# Patient Record
Sex: Female | Born: 1990 | Hispanic: Yes | Marital: Married | Smoking: Never smoker
Health system: Southern US, Community
[De-identification: ages and names within clinical notes are randomized; demographics above are authoritative.]

## PROBLEM LIST (undated history)

## (undated) DIAGNOSIS — R87629 Unspecified abnormal cytological findings in specimens from vagina: Secondary | ICD-10-CM

## (undated) HISTORY — PX: COLPOSCOPY: SHX161

## (undated) HISTORY — DX: Unspecified abnormal cytological findings in specimens from vagina: R87.629

## (undated) HISTORY — PX: LEEP: SHX91

---

## 2016-03-29 HISTORY — PX: DILATION AND CURETTAGE OF UTERUS: SHX78

## 2019-08-10 ENCOUNTER — Other Ambulatory Visit: Payer: Self-pay | Admitting: Family Medicine

## 2019-08-10 ENCOUNTER — Encounter: Payer: Self-pay | Admitting: Family Medicine

## 2019-08-10 ENCOUNTER — Ambulatory Visit (INDEPENDENT_AMBULATORY_CARE_PROVIDER_SITE_OTHER): Payer: 59 | Admitting: Family Medicine

## 2019-08-10 ENCOUNTER — Other Ambulatory Visit: Payer: Self-pay

## 2019-08-10 VITALS — BP 101/69 | HR 77 | Temp 98.4°F | Ht 68.0 in | Wt 168.0 lb

## 2019-08-10 DIAGNOSIS — R102 Pelvic and perineal pain: Secondary | ICD-10-CM | POA: Diagnosis not present

## 2019-08-10 DIAGNOSIS — Z8742 Personal history of other diseases of the female genital tract: Secondary | ICD-10-CM

## 2019-08-10 DIAGNOSIS — E7800 Pure hypercholesterolemia, unspecified: Secondary | ICD-10-CM | POA: Diagnosis not present

## 2019-08-10 MED ORDER — NAPROXEN SODIUM 550 MG PO TABS: 550.0000 mg | ORAL_TABLET | Freq: Two times a day (BID) | ORAL | 3 refills | Status: DC

## 2019-08-10 NOTE — Patient Instructions (Addendum)
If you have lab work done today you will be contacted with your lab results within the next 2 weeks.  If you have not heard from [place] then please contact us. The fastest way to get your results is to register for My Chart.   IF you received an x-ray today, you will receive an invoice from [facility]. Please contact [facility] at [phone] with questions or concerns regarding your invoice.   IF you received labwork today, you will receive an invoice from [place]. Please contact [facility] at [phone] with questions or concerns regarding your invoice.   Our billing staff will not be able to assist you with questions regarding bills from these companies.  You will be contacted with the lab results as soon as they are available. The fastest way to get your results is to activate your My Chart account. Instructions are located on the last page of this paperwork. If you have not heard from [place] regarding the results in 2 weeks, please contact this office.        Mittelschmerz Mittelschmerz Mittelschmerz es Human resources officer vientre (abdomen) que afecta a las mujeres. El dolor se produce entre perodos [place]. El dolor:  Afecta uno de los lados del vientre. El lado puede variar de un mes a otro.  Puede ser leve o muy intenso.  Puede durar minutos u horas. No dura ms de 1 o 2 das.  Puede ocurrir con una sensacin de Journalist, newspaper (nuseas).  Puede ocurrir con una pequea cantidad de sangrado de la vagina. Mittelschmerz es causada por el crecimiento y la liberacin de un vulo de un ovario. Es Neomia Dear parte natural del ciclo de ovulacin. A menudo ocurre unas 2 semanas despus de la finalizacin del perodo menstrual. Siga estas indicaciones en su casa: Controle la afeccin para Insurance risk surveyor cambio. Informe a su mdico acerca de los cambios. Tome estas medidas para aliviar el dolor:  Intente tomar un bao caliente.  Intente colocarse una  almohadilla trmica para relajar los msculos del vientre.  Tome los medicamentos de venta libre y los recetados solamente como se lo haya indicado el mdico.  Oceanographer a todas las visitas de seguimiento como se lo haya indicado el mdico. Esto es importante. Comunquese con un mdico si:  El dolor es muy intenso la mayora de [place].  El dolor dura ms de 24 horas.  El medicamento no IT trainer.  Tiene fiebre.  Siente Journalist, newspaper, y la sensacin no se va.  No deja de vomitar.  No le llega el perodo menstrual.  Tiene ms que una pequea cantidad de sangre que proviene de la vagina entre perodos [place]. Resumen  Mittelschmerz es una afeccin que afecta a las mujeres. Es Chief Technology Officer en el vientre que aparece [employer] perodos.  Esta afeccin es causada por el crecimiento y la liberacin de un vulo de un ovario.  El dolor puede afectar uno de los lados del vientre, puede ser leve o muy intenso, o puede hacer que sienta Journalist, newspaper.  Para ayudar con el dolor, tome un bao de inmersin caliente, utilice una almohadilla trmica o tome medicamentos.  Controle la afeccin para Insurance risk surveyor cambio. Sepa cundo debe ponerse en contacto con un mdico para pedir ayuda. Esta informacin no tiene Theme park manager el consejo del mdico. Asegrese de hacerle al mdico cualquier pregunta que tenga. Document Revised: 11/16/2017 Document Reviewed: 11/16/2017 Elsevier Patient Education  2020 Elsevier Inc.  

## 2019-08-10 NOTE — Progress Notes (Signed)
5/14/202110:30 AM  Dorothy Wells [birth year], 29 y.o., female [id number]  Chief Complaint  Patient presents with  . pelvic pain 2 weeks prior to menstrual period  . New Patient (Initial Visit)    HPI:   Patient is a 29 y.o. female who presents today for pelvic pain  About a month ago started having pelvic pain, right side, stabbing for about 3 days and then milder for about a week, about 1-2 weeks prior to menses 07/10/2019 - normal menses LMP Aug 08 2019 Denies any vaginal discharge, dysuria, nausea, vomiting, fever, chills, changes in BM She has never had similar pain Has h/o left ovarian cyst Sexually active, uses condoms G2P1011 - no GMD, mild BP elevation PP, no dx preclampsia Last pap 2019 in [place], per patient normal  She reports h/o elevated cholesterol She is fasting today and would like to have it rechecked     Depression screen PHQ 2/9 08/10/2019  Decreased Interest 0  Down, Depressed, Hopeless 0  PHQ - 2 Score 0    Fall Risk  08/10/2019  Falls in the past year? 0  Number falls in past yr: 0  Injury with Fall? 0  Follow up Falls evaluation completed     No Known Allergies  Prior to Admission medications   Not on File    History reviewed. No pertinent past medical history.  Past Surgical History:  Procedure Laterality Date  . DILATION AND CURETTAGE OF UTERUS  2018   miscarriage    Social History   Tobacco Use  . Smoking status: Never Smoker  . Smokeless tobacco: Never Used  Substance Use Topics  . Alcohol use: Never    Family History  Problem Relation Age of Onset  . Arthritis Mother   . Hypertension Maternal Grandfather   . Hypertension Paternal Grandfather     ROS Per hpi  OBJECTIVE:  Today's Vitals   08/10/19 1010  BP: 101/69  Pulse: 77  Temp: 98.4 F (36.9 C)  SpO2: 99%  Weight: 168 lb (76.2 kg)  Height: 5\' 8"  (1.727 m)   Body mass index is 25.54 kg/m.   Physical Exam Vitals and nursing note reviewed.   Constitutional:      Appearance: She is well-developed.  HENT:     Head: Normocephalic and atraumatic.     Mouth/Throat:     Pharynx: No oropharyngeal exudate.  Eyes:     General: No scleral icterus.    Conjunctiva/sclera: Conjunctivae normal.     Pupils: Pupils are equal, round, and reactive to light.  Cardiovascular:     Rate and Rhythm: Normal rate and regular rhythm.     Heart sounds: Normal heart sounds. No murmur. No friction rub. No gallop.   Pulmonary:     Effort: Pulmonary effort is normal.     Breath sounds: Normal breath sounds. No wheezing, rhonchi or rales.  Abdominal:     General: Bowel sounds are normal. There is no distension.     Palpations: Abdomen is soft.     Tenderness: There is no abdominal tenderness. There is no right CVA tenderness or left CVA tenderness.  Musculoskeletal:     Cervical back: Neck supple.  Skin:    General: Skin is warm and dry.  Neurological:     Mental Status: She is alert and oriented to person, place, and time.     No results found for this or any previous visit (from the past 24 hour(s)).  No results found.   ASSESSMENT  and PLAN  1. Pelvic pain History suggestive of mittelschmerz. Discussed supportive measures, new meds r/se/b and RTC precautions. Patient educational handout given.  2. History of ovarian cyst - US Pelvic Complete With Transvaginal; Future  3. Elevated cholesterol - Comprehensive metabolic panel - Lipid panel  Other orders - naproxen sodium (ANAPROX) 550 MG tablet; Take 1 tablet (550 mg total) by mouth 2 (two) times daily with a meal. Start taking 2 days prior to ovulation  Return if symptoms worsen or fail to improve.    Myles Lipps, MD Primary Care at [facility] [address] [place], [place] [address] Ph.  [phone] Fax [phone]

## 2019-08-11 LAB — COMPREHENSIVE METABOLIC PANEL
ALT: 8 IU/L (ref 0–32)
AST: 12 IU/L (ref 0–40)
Albumin/Globulin Ratio: 1.6 (ref 1.2–2.2)
Albumin: 4.3 g/dL (ref 3.9–5.0)
Alkaline Phosphatase: 129 IU/L — ABNORMAL HIGH (ref 39–117)
BUN/Creatinine Ratio: 16 (ref 9–23)
BUN: 12 mg/dL (ref 6–20)
Bilirubin Total: <0.2 mg/dL (ref 0.0–1.2)
CO2: 22 mmol/L (ref 20–29)
Calcium: 9.4 mg/dL (ref 8.7–10.2)
Chloride: 105 mmol/L (ref 96–106)
Creatinine, Ser: 0.73 mg/dL (ref 0.57–1.00)
GFR calc Af Amer: 131 mL/min/{1.73_m2} (ref >59)
GFR calc non Af Amer: 113 mL/min/{1.73_m2} (ref >59)
Globulin, Total: 2.7 g/dL (ref 1.5–4.5)
Glucose: 94 mg/dL (ref 65–99)
Potassium: 4.7 mmol/L (ref 3.5–5.2)
Sodium: 140 mmol/L (ref 134–144)
Total Protein: 7 g/dL (ref 6.0–8.5)

## 2019-08-11 LAB — LIPID PANEL
Chol/HDL Ratio: 3 ratio (ref 0.0–4.4)
Cholesterol, Total: 162 mg/dL (ref 100–199)
HDL: 54 mg/dL (ref >39)
LDL Chol Calc (NIH): 96 mg/dL (ref 0–99)
Triglycerides: 60 mg/dL (ref 0–149)
VLDL Cholesterol Cal: 12 mg/dL (ref 5–40)

## 2019-08-17 ENCOUNTER — Ambulatory Visit
Admission: RE | Admit: 2019-08-17 | Discharge: 2019-08-17 | Disposition: A | Discharge status: Home / Self Care | Payer: 59 | Source: Ambulatory Visit | Attending: Family Medicine | Admitting: Family Medicine

## 2019-08-17 DIAGNOSIS — Z8742 Personal history of other diseases of the female genital tract: Secondary | ICD-10-CM

## 2019-08-17 DIAGNOSIS — R102 Pelvic and perineal pain: Secondary | ICD-10-CM

## 2019-08-23 ENCOUNTER — Encounter: Payer: Self-pay | Admitting: Family Medicine

## 2019-10-05 ENCOUNTER — Ambulatory Visit (INDEPENDENT_AMBULATORY_CARE_PROVIDER_SITE_OTHER): Payer: 59 | Admitting: Family Medicine

## 2019-10-05 ENCOUNTER — Encounter: Payer: Self-pay | Admitting: Family Medicine

## 2019-10-05 ENCOUNTER — Other Ambulatory Visit: Payer: Self-pay

## 2019-10-05 VITALS — BP 118/75 | HR 82 | Temp 98.4°F | Ht 68.0 in | Wt 165.6 lb

## 2019-10-05 DIAGNOSIS — L858 Other specified epidermal thickening: Secondary | ICD-10-CM | POA: Diagnosis not present

## 2019-10-05 DIAGNOSIS — L739 Follicular disorder, unspecified: Secondary | ICD-10-CM

## 2019-10-05 MED ORDER — CEPHALEXIN 500 MG PO CAPS
500.0000 mg | ORAL_CAPSULE | Freq: Three times a day (TID) | ORAL | 0 refills | Status: DC
Start: 2019-10-05 — End: 2021-03-09

## 2019-10-05 NOTE — Progress Notes (Signed)
7/9/202111:53 AM  Dorothy Wells 11-Jul-1990, 29 y.o., female [id number]  Chief Complaint  Patient presents with  . right arm skin rash    red bumps      HPI:   Patient is a 29 y.o. female who presents today for rash on her right upper arm Reports it started during pregnancy in both her arms, it felt dry and bumpy but now for past several weeks, right arm getting worse, more bumpy, red, warm at times  Breastfeeding   Depression screen [facility] 2/9 10/05/2019 08/10/2019  Decreased Interest 0 0  Down, Depressed, Hopeless 0 0  PHQ - 2 Score 0 0    Fall Risk  10/05/2019 08/10/2019  Falls in the past year? 0 0  Number falls in past yr: 0 0  Injury with Fall? 0 0  Follow up Falls evaluation completed Falls evaluation completed     No Known Allergies  Prior to Admission medications   Medication Sig Start Date End Date Taking? Authorizing Provider  Multiple Vitamins-Minerals (MULTIVITAMIN WITH MINERALS) tablet Take 1 tablet by mouth daily.   Yes [provider]  naproxen sodium (ANAPROX) 550 MG tablet Take 1 tablet (550 mg total) by mouth 2 (two) times daily with a meal. Start taking 2 days prior to ovulation 08/10/19  Yes Myles Lipps, MD    No past medical history on file.  Past Surgical History:  Procedure Laterality Date  . DILATION AND CURETTAGE OF UTERUS  2018   miscarriage    Social History   Tobacco Use  . Smoking status: Never Smoker  . Smokeless tobacco: Never Used  Substance Use Topics  . Alcohol use: Never    Family History  Problem Relation Age of Onset  . Arthritis Mother   . Hypertension Maternal Grandfather   . Hypertension Paternal Grandfather     ROS Per hpi  OBJECTIVE:  Today's Vitals   10/05/19 1138  BP: 118/75  Pulse: 82  Temp: 98.4 F (36.9 C)  TempSrc: Temporal  SpO2: 98%  Weight: 165 lb 9.6 oz (75.1 kg)  Height: 5\' 8"  (1.727 m)   Body mass index is 25.18 kg/m.   Physical Exam Vitals and nursing note  reviewed.  Constitutional:      Appearance: She is well-developed.  HENT:     Head: Normocephalic and atraumatic.  Eyes:     General: No scleral icterus.    Conjunctiva/sclera: Conjunctivae normal.     Pupils: Pupils are equal, round, and reactive to light.  Pulmonary:     Effort: Pulmonary effort is normal.  Musculoskeletal:     Cervical back: Neck supple.  Skin:    General: Skin is warm and dry.     Findings: Rash (right arm with KP and superimposed  folliculitis) present.  Neurological:     Mental Status: She is alert and oriented to person, place, and time.     No results found for this or any previous visit (from the past 24 hour(s)).  No results found.   ASSESSMENT and PLAN  1. Folliculitis rx for keflex, reviewed r/se/b  2. Keratosis pilaris Discussed supportive measures and OTC lotions. Patient educational handout given.  Other orders - Multiple Vitamins-Minerals (MULTIVITAMIN WITH MINERALS) tablet; Take 1 tablet by mouth daily. - cephALEXin (KEFLEX) 500 MG capsule; Take 1 capsule (500 mg total) by mouth 3 (three) times daily.  No follow-ups on file.    , MD Primary Care at [facility] [address] [place], [place]  [address] Ph.  [phone] Fax [phone]

## 2019-10-05 NOTE — Patient Instructions (Signed)
° ° ° °  If you have lab work done today you will be contacted with your lab results within the next 2 weeks.  If you have not heard from us then please contact us. The fastest way to get your results is to register for My Chart. ° ° °IF you received an x-ray today, you will receive an invoice from [facility]. Please contact [facility] at [phone] with questions or concerns regarding your invoice.  ° °IF you received labwork today, you will receive an invoice from [facility]. Please contact [facility] at [phone] with questions or concerns regarding your invoice.  ° °Our billing staff will not be able to assist you with questions regarding bills from these companies. ° °You will be contacted with the lab results as soon as they are available. The fastest way to get your results is to activate your My Chart account. Instructions are located on the last page of this paperwork. If you have not heard from us regarding the results in 2 weeks, please contact this office. °  ° ° ° °

## 2020-10-23 LAB — OB RESULTS CONSOLE GC/CHLAMYDIA
Chlamydia: NEGATIVE
Gonorrhea: NEGATIVE

## 2020-11-27 LAB — HEPATITIS C ANTIBODY: HCV Ab: NEGATIVE

## 2020-11-27 LAB — OB RESULTS CONSOLE HEPATITIS B SURFACE ANTIGEN: Hepatitis B Surface Ag: NEGATIVE

## 2020-11-27 LAB — OB RESULTS CONSOLE ANTIBODY SCREEN: Antibody Screen: NEGATIVE

## 2020-11-27 LAB — OB RESULTS CONSOLE ABO/RH: RH Type: POSITIVE

## 2020-11-27 LAB — OB RESULTS CONSOLE RUBELLA ANTIBODY, IGM: Rubella: IMMUNE

## 2021-02-06 ENCOUNTER — Other Ambulatory Visit: Payer: Self-pay | Admitting: Obstetrics and Gynecology

## 2021-02-06 DIAGNOSIS — Z363 Encounter for antenatal screening for malformations: Secondary | ICD-10-CM

## 2021-02-17 ENCOUNTER — Other Ambulatory Visit: Payer: Self-pay

## 2021-02-25 ENCOUNTER — Other Ambulatory Visit: Payer: 59

## 2021-02-25 ENCOUNTER — Ambulatory Visit: Payer: 59

## 2021-03-04 ENCOUNTER — Encounter: Payer: Self-pay | Admitting: *Deleted

## 2021-03-09 ENCOUNTER — Other Ambulatory Visit: Payer: Self-pay

## 2021-03-09 ENCOUNTER — Encounter: Payer: Self-pay | Admitting: *Deleted

## 2021-03-09 ENCOUNTER — Ambulatory Visit: Payer: 59 | Attending: Obstetrics and Gynecology

## 2021-03-09 ENCOUNTER — Ambulatory Visit: Payer: 59 | Admitting: *Deleted

## 2021-03-09 VITALS — BP 118/61 | HR 104

## 2021-03-09 DIAGNOSIS — O468X2 Other antepartum hemorrhage, second trimester: Secondary | ICD-10-CM | POA: Diagnosis present

## 2021-03-09 DIAGNOSIS — O418X20 Other specified disorders of amniotic fluid and membranes, second trimester, not applicable or unspecified: Secondary | ICD-10-CM | POA: Diagnosis present

## 2021-03-09 DIAGNOSIS — Z363 Encounter for antenatal screening for malformations: Secondary | ICD-10-CM | POA: Diagnosis present

## 2021-03-09 LAB — OB RESULTS CONSOLE RPR: RPR: NONREACTIVE

## 2021-03-29 NOTE — L&D Delivery Note (Signed)
?  ?  Faculty Practice OB/GYN Attending Note ?  ?I was called about patient who delivered in her car en route to [employer] she was at the entrance.  On initial evaluation at the [facility] entrance, patient was in her car, carrying her baby who appeared stable, still attached to cord and placenta was still in utero.  Mother and baby were moved to wheelchair and taken to L&D room. ?  ?In the L&D room, she was noted to have minimal bleeding.  Placenta delivered around 1801, intact with 3VC.  Superficial hemostatic periurethral laceration noted, no need for stitches. ?  ?  ?Dr. Mindi Slicker (covering OB) was notified and will assume care of patient. Routine postpartum care for now.  ?  ?  ?Jaynie Collins, MD, FACOG ?Obstetrician Heritage manager, Faculty Practice ?Center for [employer], [facility] ?  ?

## 2021-05-19 LAB — OB RESULTS CONSOLE GBS: GBS: NEGATIVE

## 2021-05-25 ENCOUNTER — Inpatient Hospital Stay (EMERGENCY_DEPARTMENT_HOSPITAL)
Admission: AD | Admit: 2021-05-25 | Discharge: 2021-05-25 | Disposition: A | Discharge status: Home / Self Care | Payer: Commercial Managed Care - HMO | Source: Home / Self Care | Attending: Obstetrics | Admitting: Obstetrics

## 2021-05-25 ENCOUNTER — Other Ambulatory Visit: Payer: Self-pay

## 2021-05-25 ENCOUNTER — Encounter (HOSPITAL_COMMUNITY): Payer: Self-pay | Admitting: Obstetrics

## 2021-05-25 DIAGNOSIS — R202 Paresthesia of skin: Secondary | ICD-10-CM | POA: Insufficient documentation

## 2021-05-25 DIAGNOSIS — O26893 Other specified pregnancy related conditions, third trimester: Secondary | ICD-10-CM | POA: Insufficient documentation

## 2021-05-25 DIAGNOSIS — O471 False labor at or after 37 completed weeks of gestation: Secondary | ICD-10-CM | POA: Insufficient documentation

## 2021-05-25 DIAGNOSIS — Z3A37 37 weeks gestation of pregnancy: Secondary | ICD-10-CM | POA: Insufficient documentation

## 2021-05-25 DIAGNOSIS — O479 False labor, unspecified: Secondary | ICD-10-CM | POA: Diagnosis not present

## 2021-05-25 NOTE — MAU Note (Signed)
Pt reports she has ben having vaginal pain and pressure  off and on since Friday. Today is more intense. Denies any vag bleeding or leaking at this time.Good fetal movement felt. Reports her stomach feel hard at times. 3cm on last exam. Also c/o her  left leg feels numb like it is falling asleep.

## 2021-05-25 NOTE — Discharge Instructions (Signed)
Go to this website and do these exercises to help get the baby in the correct position.  [email or web address]

## 2021-05-25 NOTE — MAU Provider Note (Signed)
°  History     CSN: [id number]  Arrival date and time: 05/25/21 1742   Event Date/Time   First Provider Initiated Contact with Patient 05/25/21 1903      Chief Complaint  Patient presents with   Abdominal Pain   Leg Pain   HPI This is a 31 year old G3 P1-0-1-1 at 37 weeks and 6 days who presents with left leg paresthesias that have been fairly constant today.  It was worse in the morning and that has gotten better as the day has gone on.  She has been having some contractions with back pain with sciatica.  She was seen last week and had a cervical exam.  She was 3 cm at that time.  OB History     Gravida  3   Para  1   Term  1   Preterm      AB  1   Living  1      SAB  1   IAB      Ectopic      Multiple      Live Births              Past Medical History:  Diagnosis Date   Vaginal Pap smear, abnormal     Past Surgical History:  Procedure Laterality Date   COLPOSCOPY     DILATION AND CURETTAGE OF UTERUS  2018   miscarriage   LEEP      Family History  Problem Relation Age of Onset   Arthritis Mother    Hypertension Maternal Grandfather    Hypertension Paternal Grandfather     Social History   Tobacco Use   Smoking status: Never   Smokeless tobacco: Never  Vaping Use   Vaping Use: Never used  Substance Use Topics   Alcohol use: Never   Drug use: Never    Allergies: No Known Allergies  Medications Prior to Admission  Medication Sig Dispense Refill Last Dose   Multiple Vitamins-Minerals (MULTIVITAMIN WITH MINERALS) tablet Take 1 tablet by mouth daily.   05/25/2021    Review of Systems Physical Exam   Blood pressure 116/70, pulse 99, temperature 98.4 F (36.9 C), resp. rate 18, last menstrual period 08/02/2020, SpO2 98 %.  Physical Exam Vitals reviewed.  Constitutional:      Appearance: She is well-developed.  HENT:     Head: Normocephalic and atraumatic.  Abdominal:     Tenderness: There is no abdominal tenderness.      Comments: Mild tenderness on the fundus.  Skin:    General: Skin is warm and dry.     Capillary Refill: Capillary refill takes less than 2 seconds.  Neurological:     Mental Status: She is alert.    MAU Course  Procedures NST:  Baseline: 150  Variability: moderate Accelerations: ++  Decelerations: none Contractions: every 8-10 minute  Bedside US Fetus vertex - OP position. Placenta anterior - no evidence of abruption.  MDM  Assessment and Plan   1. [redacted] weeks gestation of pregnancy   2. Left leg paresthesias   3. False labor    Discharge to home Recommended [employer] to help with baby positioning.  Levie Heritage 05/25/2021, 7:04 PM

## 2021-05-28 ENCOUNTER — Other Ambulatory Visit: Payer: Self-pay

## 2021-05-28 ENCOUNTER — Encounter (HOSPITAL_COMMUNITY): Payer: Self-pay | Admitting: Obstetrics and Gynecology

## 2021-05-28 ENCOUNTER — Inpatient Hospital Stay (HOSPITAL_COMMUNITY)
Admission: AD | Admit: 2021-05-28 | Discharge: 2021-05-29 | DRG: 807 | Disposition: A | Discharge status: Home / Self Care | Payer: Commercial Managed Care - HMO | Attending: Obstetrics and Gynecology | Admitting: Obstetrics and Gynecology

## 2021-05-28 ENCOUNTER — Inpatient Hospital Stay (HOSPITAL_COMMUNITY)
Admission: AD | Admit: 2021-05-28 | Discharge: 2021-05-28 | Disposition: A | Discharge status: Home / Self Care | Payer: Commercial Managed Care - HMO | Source: Home / Self Care | Attending: Obstetrics and Gynecology | Admitting: Obstetrics and Gynecology

## 2021-05-28 DIAGNOSIS — Z3A38 38 weeks gestation of pregnancy: Secondary | ICD-10-CM

## 2021-05-28 DIAGNOSIS — O471 False labor at or after 37 completed weeks of gestation: Secondary | ICD-10-CM | POA: Insufficient documentation

## 2021-05-28 DIAGNOSIS — O4202 Full-term premature rupture of membranes, onset of labor within 24 hours of rupture: Secondary | ICD-10-CM | POA: Diagnosis not present

## 2021-05-28 DIAGNOSIS — O479 False labor, unspecified: Secondary | ICD-10-CM

## 2021-05-28 LAB — TYPE AND SCREEN
ABO/RH(D): O POS
Antibody Screen: NEGATIVE

## 2021-05-28 MED ORDER — ONDANSETRON HCL 4 MG PO TABS: 4.0000 mg | ORAL_TABLET | ORAL | Status: DC | PRN

## 2021-05-28 MED ORDER — SOD CITRATE-CITRIC ACID 500-334 MG/5ML PO SOLN: 30.0000 mL | ORAL | Status: DC | PRN

## 2021-05-28 MED ORDER — OXYTOCIN 10 UNIT/ML IJ SOLN
INTRAMUSCULAR | Status: AC
  Filled 2021-05-28: qty 1

## 2021-05-28 MED ORDER — IBUPROFEN 600 MG PO TABS
600.0000 mg | ORAL_TABLET | Freq: Four times a day (QID) | ORAL | Status: DC
  Administered 2021-05-28 – 2021-05-29 (×4): 600 mg via ORAL
  Filled 2021-05-28 (×4): qty 1

## 2021-05-28 MED ORDER — LIDOCAINE HCL (PF) 1 % IJ SOLN: 30.0000 mL | INTRAMUSCULAR | Status: DC | PRN

## 2021-05-28 MED ORDER — ACETAMINOPHEN 325 MG PO TABS: 650.0000 mg | ORAL_TABLET | ORAL | Status: DC | PRN

## 2021-05-28 MED ORDER — OXYTOCIN BOLUS FROM INFUSION: 333.0000 mL | Freq: Once | INTRAVENOUS | Status: DC

## 2021-05-28 MED ORDER — WITCH HAZEL-GLYCERIN EX PADS: 1.0000 "application " | MEDICATED_PAD | CUTANEOUS | Status: DC | PRN

## 2021-05-28 MED ORDER — OXYCODONE HCL 5 MG PO TABS: 10.0000 mg | ORAL_TABLET | ORAL | Status: DC | PRN

## 2021-05-28 MED ORDER — LACTATED RINGERS IV SOLN: 500.0000 mL | INTRAVENOUS | Status: DC | PRN

## 2021-05-28 MED ORDER — OXYCODONE-ACETAMINOPHEN 5-325 MG PO TABS: 2.0000 | ORAL_TABLET | ORAL | Status: DC | PRN

## 2021-05-28 MED ORDER — SIMETHICONE 80 MG PO CHEW: 80.0000 mg | CHEWABLE_TABLET | ORAL | Status: DC | PRN

## 2021-05-28 MED ORDER — BENZOCAINE-MENTHOL 20-0.5 % EX AERO: 1.0000 "application " | INHALATION_SPRAY | CUTANEOUS | Status: DC | PRN

## 2021-05-28 MED ORDER — TETANUS-DIPHTH-ACELL PERTUSSIS 5-2.5-18.5 LF-MCG/0.5 IM SUSY: 0.5000 mL | PREFILLED_SYRINGE | Freq: Once | INTRAMUSCULAR | Status: DC

## 2021-05-28 MED ORDER — ONDANSETRON HCL 4 MG/2ML IJ SOLN: 4.0000 mg | INTRAMUSCULAR | Status: DC | PRN

## 2021-05-28 MED ORDER — PRENATAL MULTIVITAMIN CH
1.0000 | ORAL_TABLET | Freq: Every day | ORAL | Status: DC
  Administered 2021-05-29: 1 via ORAL
  Filled 2021-05-28: qty 1

## 2021-05-28 MED ORDER — OXYCODONE-ACETAMINOPHEN 5-325 MG PO TABS: 1.0000 | ORAL_TABLET | ORAL | Status: DC | PRN

## 2021-05-28 MED ORDER — SENNOSIDES-DOCUSATE SODIUM 8.6-50 MG PO TABS
2.0000 | ORAL_TABLET | Freq: Every day | ORAL | Status: DC
  Administered 2021-05-29: 2 via ORAL
  Filled 2021-05-28: qty 2

## 2021-05-28 MED ORDER — DIBUCAINE (PERIANAL) 1 % EX OINT: 1.0000 "application " | TOPICAL_OINTMENT | CUTANEOUS | Status: DC | PRN

## 2021-05-28 MED ORDER — ZOLPIDEM TARTRATE 5 MG PO TABS: 5.0000 mg | ORAL_TABLET | Freq: Every evening | ORAL | Status: DC | PRN

## 2021-05-28 MED ORDER — COCONUT OIL OIL: 1.0000 "application " | TOPICAL_OIL | Status: DC | PRN

## 2021-05-28 MED ORDER — LACTATED RINGERS IV SOLN: INTRAVENOUS | Status: DC

## 2021-05-28 MED ORDER — OXYTOCIN-SODIUM CHLORIDE 30-0.9 UT/500ML-% IV SOLN: 2.5000 [IU]/h | INTRAVENOUS | Status: DC

## 2021-05-28 MED ORDER — OXYCODONE HCL 5 MG PO TABS: 5.0000 mg | ORAL_TABLET | ORAL | Status: DC | PRN

## 2021-05-28 MED ORDER — DIPHENHYDRAMINE HCL 25 MG PO CAPS: 25.0000 mg | ORAL_CAPSULE | Freq: Four times a day (QID) | ORAL | Status: DC | PRN

## 2021-05-28 MED ORDER — OXYTOCIN 10 UNIT/ML IJ SOLN
10.0000 [IU] | Freq: Once | INTRAMUSCULAR | Status: AC
  Administered 2021-05-28: 10 [IU] via INTRAMUSCULAR

## 2021-05-28 MED ORDER — ONDANSETRON HCL 4 MG/2ML IJ SOLN: 4.0000 mg | Freq: Four times a day (QID) | INTRAMUSCULAR | Status: DC | PRN

## 2021-05-28 NOTE — MAU Provider Note (Signed)
?History  ?  ? ?[id number] ? ?Arrival date and time: 05/28/21 0910 ?  ? ?Chief Complaint  ?Patient presents with  ? Contractions  ? ? ? ?HPI ?Dorothy Wells is a 31 y.o. at [redacted]w[redacted]d, who presents for contractions.  ? ?Vaginal bleeding: No, just some light pink discharge ?LOF: No ?Fetal Movement: Yes ?Contractions: Yes, started yesterday and became more intense this morning ? ?  ? ?OB History   ? ? Gravida  ?3  ? Para  ?1  ? Term  ?1  ? Preterm  ?   ? AB  ?1  ? Living  ?1  ?  ? ? SAB  ?1  ? IAB  ?   ? Ectopic  ?   ? Multiple  ?   ? Live Births  ?   ?   ?  ?  ? ? ?Past Medical History:  ?Diagnosis Date  ? Vaginal Pap smear, abnormal   ? ? ?Past Surgical History:  ?Procedure Laterality Date  ? COLPOSCOPY    ? [place] OF UTERUS  2018  ? miscarriage  ? LEEP    ? ? ?Family History  ?Problem Relation Age of Onset  ? Arthritis Mother   ? Hypertension Maternal Grandfather   ? Hypertension Paternal Grandfather   ? ? ?Social History  ? ?Socioeconomic History  ? Marital status: Married  ?  Spouse name: Not on file  ? Number of children: Not on file  ? Years of education: Not on file  ? Highest education level: Not on file  ?Occupational History  ? Not on file  ?Tobacco Use  ? Smoking status: Never  ? Smokeless tobacco: Never  ?Vaping Use  ? Vaping Use: Never used  ?Substance and Sexual Activity  ? Alcohol use: Never  ? Drug use: Never  ? Sexual activity: Yes  ?Other Topics Concern  ? Not on file  ?Social History Narrative  ? Not on file  ? ?Social Determinants of Health  ? ?Financial Resource Strain: Not on file  ?Food Insecurity: Not on file  ?Transportation Needs: Not on file  ?Physical Activity: Not on file  ?Stress: Not on file  ?Social Connections: Not on file  ?Intimate Partner Violence: Not on file  ? ? ?No Known Allergies ? ?No current facility-administered medications on file prior to encounter.  ? ?Current Outpatient Medications on File Prior to Encounter  ?Medication Sig Dispense Refill  ? Multiple  Vitamins-Minerals (MULTIVITAMIN WITH MINERALS) tablet Take 1 tablet by mouth daily.    ? ? ? ?ROS ?Pertinent positives and negative per HPI, all others reviewed and negative ? ?Physical Exam  ? ?BP 122/71 (BP Location: Right Arm)   Pulse 88   Temp 98.3 ?F (36.8 ?C) (Oral)   Resp 16   LMP 08/02/2020   SpO2 98%  ? ?Patient Vitals for the past 24 hrs: ? BP Temp Temp src Pulse Resp SpO2  ?05/28/21 1028 122/71 -- -- 88 16 --  ?05/28/21 0926 125/80 -- -- (!) 109 -- 98 %  ?05/28/21 0920 124/77 98.3 ?F (36.8 ?C) Oral (!) 105 15 99 %  ? ? ?Physical Exam ?Vitals reviewed.  ?Constitutional:   ?   General: She is not in acute distress. ?   Appearance: She is well-developed. She is not diaphoretic.  ?Eyes:  ?   General: No scleral icterus. ?Pulmonary:  ?   Effort: Pulmonary effort is normal. No respiratory distress.  ?Abdominal:  ?   General: There  is no distension.  ?   Palpations: Abdomen is soft.  ?   Tenderness: There is no abdominal tenderness. There is no guarding or rebound.  ?Skin: ?   General: Skin is warm and dry.  ?Neurological:  ?   Mental Status: She is alert.  ?   Coordination: Coordination normal.  ?  ? ?Cervical Exam ?Dilation: Fingertip ?Effacement (%): Thick ?Station: -2 ?Exam by:: Clayton Lefort, MD ? ?Bedside Ultrasound ?Not done ? ?My interpretation: n/a ? ?FHT ?Baseline 145, moderate variability, +accels, no decels ?Toco: rare inverted contractions ?Cat: I ? ?Labs ?No results found for this or any previous visit (from the past 24 hour(s)). ? ?Imaging ?No results found. ? ?MAU Course  ?Procedures ?Lab Orders  ?No laboratory test(s) ordered today  ? ?No orders of the defined types were placed in this encounter. ? ?Imaging Orders  ?No imaging studies ordered today  ? ? ?MDM ?Minimal ? ?Assessment and Plan  ?#False labor ?#[redacted] weeks gestation of pregnancy ?No cervical change, reviewed labor precautions.  ? ?#FWB ?FHT Cat I ?NST: Reactive ? ? ?Dispo: discharged to home in stable condition. ? ? ? ?Clarnce Flock, MD/MPH ?05/28/21 ?10:34 AM ? ?Allergies as of 05/28/2021   ?No Known Allergies ?  ? ?  ?Medication List  ?  ? ?TAKE these medications   ? ?multivitamin with minerals tablet ?Take 1 tablet by mouth daily. ?  ? ?  ? ? ?

## 2021-05-28 NOTE — Progress Notes (Signed)
? ? ?  Faculty Practice OB/GYN Attending Note ?  ?I was called about patient who delivered in her car en route to [facility] she was at the entrance.  On initial evaluation at the [facility] entrance, patient was in her car, carrying her baby who appeared stable, still attached to cord and placenta was still in utero.  Mother and baby were moved to wheelchair and taken to L&D room. ?  ?In the L&D room, she was noted to have minimal bleeding.  Placenta delivered around 1801, intact with 3VC.  Superficial hemostatic periurethral laceration noted, no need for stitches. ?  ?  ?Dr. Banga (covering OB) was notified and will assume care of patient. Routine postpartum care for now.  ?  ?  ?Dorothy Segers, MD, FACOG ?Obstetrician & Gynecologist, Faculty Practice ?[facility], [facility] ?  ?

## 2021-05-28 NOTE — Lactation Note (Signed)
This note was copied from a baby's chart. ?Lactation Consultation Note ? ?Patient Name: Dorothy Wells ?Today's Date: 05/28/2021 ?  ?Age:29 hours ? ?LC talked to RN, Ophelia Charter, infant latched in L and D. LC to follow up on the floor.  ? ?Maternal Data ?  ? ?Feeding ?  ? ?LATCH Score ?  ? ?  ? ?  ? ?  ? ?  ? ?  ? ? ?Lactation Tools Discussed/Used ?  ? ?Interventions ?  ? ?Discharge ?  ? ?Consult Status ?  ? ? ? ?[name] ?05/28/2021, 6:52 PM ? ? ? ?

## 2021-05-28 NOTE — H&P (Signed)
Dorothy Wells is a 31 y.o.G58P1011 female who delivered at 46 2/[redacted]wks gestation en route to hospital. ?Pt was seen in MAU this am and noted to be closed - sent home around noon. She reports persistent contractions. SROM and delivery occurred spontaneously precipitously in car while husband driving.  ?Pt was dated per LMP which was confirmed with a 7wk [place]. Her pregnancy had been uncomplicated. GBS neg.  ? ?OB History   ? ? Gravida  ?3  ? Para  ?1  ? Term  ?1  ? Preterm  ?   ? AB  ?1  ? Living  ?1  ?  ? ? SAB  ?1  ? IAB  ?   ? Ectopic  ?   ? Multiple  ?   ? Live Births  ?   ?   ?  ?  ? ?Past Medical History:  ?Diagnosis Date  ? Vaginal Pap smear, abnormal   ? ?Past Surgical History:  ?Procedure Laterality Date  ? COLPOSCOPY    ? [place] OF UTERUS  2018  ? miscarriage  ? LEEP    ? ?Family History: family history includes Arthritis in her mother; Hypertension in her maternal grandfather and paternal grandfather. ?Social History:  reports that she has never smoked. She has never used smokeless tobacco. She reports that she does not drink alcohol and does not use drugs. ? ? ?  ?Maternal Diabetes: No ?Genetic Screening: Normal ?Maternal Ultrasounds/Referrals: Normal ?Fetal Ultrasounds or other Referrals:  None ?Maternal Substance Abuse:  No ?Significant Maternal Medications:  None ?Significant Maternal Lab Results:  Group B Strep negative ?Other Comments:  None ? ?Review of Systems  ?Constitutional:  Positive for activity change and fatigue.  ?Respiratory:  Negative for chest tightness and shortness of breath.   ?Cardiovascular:  Positive for leg swelling. Negative for chest pain and palpitations.  ?Gastrointestinal:  Positive for abdominal pain.  ?Genitourinary:  Positive for pelvic pain and vaginal bleeding.  ?Neurological:  Negative for headaches.  ?Psychiatric/Behavioral:  The patient is not nervous/anxious.   ?All other systems reviewed and are negative. ?Maternal Medical History:  ?Reason for  admission: Vaginal delivery ? ?Contractions: Onset was 13-24 hours ago.   ?Prenatal complications: no prenatal complications ?Prenatal Complications - Diabetes: none. ? ?  ?Blood pressure 124/71, pulse 94, temperature 98.1 ?F (36.7 ?C), temperature source Oral, resp. rate 18, last menstrual period 08/02/2020. ?Maternal Exam:  ?Introitus: Normal vulva. Normal vagina.  Pelvis: adequate for delivery.   ? ?Physical Exam ?Vitals and nursing note reviewed. Exam conducted with a chaperone present.  ?Constitutional:   ?   Appearance: Normal appearance.  ?Pulmonary:  ?   Effort: Pulmonary effort is normal.  ?Abdominal:  ?   Palpations: Abdomen is soft.  ?Genitourinary: ?   General: Normal vulva.  ?Musculoskeletal:     ?   General: Normal range of motion.  ?   Cervical back: Normal range of motion.  ?Skin: ?   General: Skin is warm and dry.  ?   Capillary Refill: Capillary refill takes 2 to 3 seconds.  ?Neurological:  ?   General: No focal deficit present.  ?   Mental Status: She is alert and oriented to person, place, and time. Mental status is at baseline.  ?Psychiatric:     ?   Mood and Affect: Mood normal.     ?   Behavior: Behavior normal.     ?   Thought Content: Thought content normal.  ?  ?  Prenatal labs: ?ABO, Rh: O/Positive/-- (09/01 0000) ?Antibody: Negative (09/01 0000) ?Rubella: Immune (09/01 0000) ?RPR: Nonreactive (12/12 0000)  ?HBsAg: Negative (09/01 0000)  ?HIV:    ?GBS: Negative/-- (02/21 0000)  ? ?Assessment/Plan: ?31yo G3P2012 female immediately post precipitous svd ?- On arrival mother and baby noted to be in stable condition ?Placenta had been delivered by Dr Domenica Reamer who met pt at entrance to hospital.  ?Hemostatic perineal laceration was reported and noted. Fundus firm, lochia as expected ?Routine pp care recommended  ?They do not desire circumcision for baby Marland Kitchen  ? ?Dorothy Wells ?05/28/2021, 6:30 PM ? ? ? ? ?

## 2021-05-28 NOTE — Lactation Note (Signed)
This note was copied from a baby's chart. ?Lactation Consultation Note ? ?Patient Name: Dorothy Wells ?Today's Date: 05/28/2021 ?Reason for consult: Initial assessment;Early term 37-38.6wks ?Age:29 hours ? ?Consult was done in Spanish: ?LC into room for initial visit. Parents report good latch and two successful feedings. Parents shared birth story -baby was born in the car while driving to the hospital.  ?Baby breastfed prior to [facility] visit, latch was not observed. Mother breastfed her first baby until she was >[redacted] weeks pregnant. Mother requests a manual pump, LC provided one with education.   ?Reviewed normal newborn behavior during first 24h, expected output, tummy size and feeding frequency.   ?  ?Feeding plan:  ?1-Breastfeeding on demand or 8-12 times in 24h period. ?2-Use manual pump as needed.  ?3-Encouraged maternal rest, hydration and food intake.  ?  ?All questions answered at this time. Provided Lactation services brochure and promoted [employer] booklet information.   ? ? ?Maternal Data ?Has patient been taught Hand Expression?: No ?Does the patient have breastfeeding experience prior to this delivery?: Yes ?How long did the patient breastfeed?: BF x40 months, first baby. ? ?Feeding ?Mother's Current Feeding Choice: Breast Milk ? ?Lactation Tools Discussed/Used ?Tools: Pump;Flanges ?Flange Size: 24 ?Breast pump type: Manual ?Pump Education: Setup, frequency, and cleaning;Milk Storage ?Reason for Pumping: mother's request ?Pumping frequency: as needed ? ?Interventions ?Interventions: Breast feeding basics reviewed;Skin to skin;Hand pump;LC Services brochure;Education;Expressed milk ? ?Discharge ?Pump: Manual;Personal ?WIC Program: Yes ? ?Consult Status ?Consult Status: Follow-up ?Date: 05/29/21 ?Follow-up type: In-patient ? ? ? ?Temeca Somma A Higuera Ancidey ?05/28/2021, 10:08 PM ? ? ? ?

## 2021-05-28 NOTE — MAU Note (Signed)
...  Dorothy Wells is a 31 y.o. at [redacted]w[redacted]d here in MAU reporting: CTX since yesterday that increased in intensity around 0600 this morning. CTX currently 5 minutes apart. Endorses seeing light pink vaginal discharge. Denies LOF. +FM.  ? ?Was 2cm in MAU on 2/27. ?Pain score: 4/10 lower abdomen ? ?FHT: 150 initial external ?Lab orders placed from triage: MAU Labor Eval ? ?

## 2021-05-29 ENCOUNTER — Encounter (HOSPITAL_COMMUNITY): Payer: Self-pay | Admitting: Obstetrics and Gynecology

## 2021-05-29 LAB — CBC
HCT: 30.7 % — ABNORMAL LOW (ref 36.0–46.0)
Hemoglobin: 10.1 g/dL — ABNORMAL LOW (ref 12.0–15.0)
MCH: 27.7 pg (ref 26.0–34.0)
MCHC: 32.9 g/dL (ref 30.0–36.0)
MCV: 84.3 fL (ref 80.0–100.0)
Platelets: 329 10*3/uL (ref 150–400)
RBC: 3.64 MIL/uL — ABNORMAL LOW (ref 3.87–5.11)
RDW: 14.6 % (ref 11.5–15.5)
WBC: 11.2 10*3/uL — ABNORMAL HIGH (ref 4.0–10.5)
nRBC: 0 % (ref 0.0–0.2)

## 2021-05-29 LAB — RPR: RPR Ser Ql: NONREACTIVE

## 2021-05-29 NOTE — Plan of Care (Signed)
?  Problem: Education: ?Goal: Knowledge of condition will improve ?Outcome: Completed/Met ?Goal: Individualized Educational Video(s) ?Outcome: Completed/Met ?  ?Problem: Activity: ?Goal: Will verbalize the importance of balancing activity with adequate rest periods ?Outcome: Completed/Met ?Goal: Ability to tolerate increased activity will improve ?Outcome: Completed/Met ?  ?Problem: Coping: ?Goal: Ability to identify and utilize available resources and services will improve ?Outcome: Completed/Met ?  ?Problem: Life Cycle: ?Goal: Chance of risk for complications during the postpartum period will decrease ?Outcome: Completed/Met ?  ?Problem: Role Relationship: ?Goal: Ability to demonstrate positive interaction with newborn will improve ?Outcome: Completed/Met ?  ?Problem: Skin Integrity: ?Goal: Demonstration of wound healing without infection will improve ?Outcome: Completed/Met ?  ?Problem: Health Behavior/Discharge Planning: ?Goal: Ability to manage health-related needs will improve ?Outcome: Completed/Met ?  ?Problem: Clinical Measurements: ?Goal: Ability to maintain clinical measurements within normal limits will improve ?Outcome: Completed/Met ?Goal: Will remain free from infection ?Outcome: Completed/Met ?Goal: Diagnostic test results will improve ?Outcome: Completed/Met ?Goal: Respiratory complications will improve ?Outcome: Completed/Met ?  ?

## 2021-05-29 NOTE — Plan of Care (Signed)
?  Problem: Education: ?Goal: Knowledge of condition will improve ?Outcome: Completed/Met ?Goal: Individualized Educational Video(s) ?Outcome: Completed/Met ?  ?Problem: Activity: ?Goal: Will verbalize the importance of balancing activity with adequate rest periods ?Outcome: Completed/Met ?Goal: Ability to tolerate increased activity will improve ?Outcome: Completed/Met ?  ?Problem: Coping: ?Goal: Ability to identify and utilize available resources and services will improve ?Outcome: Completed/Met ?  ?Problem: Life Cycle: ?Goal: Chance of risk for complications during the postpartum period will decrease ?Outcome: Completed/Met ?  ?Problem: Role Relationship: ?Goal: Ability to demonstrate positive interaction with newborn will improve ?Outcome: Completed/Met ?  ?Problem: Skin Integrity: ?Goal: Demonstration of wound healing without infection will improve ?Outcome: Completed/Met ?  ?Problem: Education: ?Goal: Knowledge of General Education information will improve ?Description: Including pain rating scale, medication(s)/side effects and non-pharmacologic comfort measures ?Outcome: Completed/Met ?  ?Problem: Health Behavior/Discharge Planning: ?Goal: Ability to manage health-related needs will improve ?Outcome: Completed/Met ?  ?Problem: Clinical Measurements: ?Goal: Ability to maintain clinical measurements within normal limits will improve ?Outcome: Completed/Met ?Goal: Will remain free from infection ?Outcome: Completed/Met ?Goal: Diagnostic test results will improve ?Outcome: Completed/Met ?Goal: Respiratory complications will improve ?Outcome: Completed/Met ?Goal: Cardiovascular complication will be avoided ?Outcome: Completed/Met ?  ?Problem: Activity: ?Goal: Risk for activity intolerance will decrease ?Outcome: Completed/Met ?  ?Problem: Nutrition: ?Goal: Adequate nutrition will be maintained ?Outcome: Completed/Met ?  ?Problem: Coping: ?Goal: Level of anxiety will decrease ?Outcome: Completed/Met ?  ?Problem:  Elimination: ?Goal: Will not experience complications related to bowel motility ?Outcome: Completed/Met ?Goal: Will not experience complications related to urinary retention ?Outcome: Completed/Met ?  ?Problem: Pain Managment: ?Goal: General experience of comfort will improve ?Outcome: Completed/Met ?  ?Problem: Safety: ?Goal: Ability to remain free from injury will improve ?Outcome: Completed/Met ?  ?Problem: Skin Integrity: ?Goal: Risk for impaired skin integrity will decrease ?Outcome: Completed/Met ?  ?

## 2021-05-29 NOTE — Progress Notes (Signed)
Patient is eating, ambulating, voiding.  Pain control is good. ? ?Vitals:  ? 05/28/21 2009 05/28/21 2100 05/29/21 0150 05/29/21 0530  ?BP: 136/80 127/70 118/74 112/72  ?Pulse: 74 88 88 84  ?Resp: 18 18 16 18   ?Temp: 98.7 ?F (37.1 ?C)  98 ?F (36.7 ?C) 98.4 ?F (36.9 ?C)  ?TempSrc: Oral   Oral  ?SpO2: 98% 97% 98% 99%  ? ? ?Fundus firm ?Perineum without swelling. ? ?Lab Results  ?Component Value Date  ? WBC 11.2 (H) 05/29/2021  ? HGB 10.1 (L) 05/29/2021  ? HCT 30.7 (L) 05/29/2021  ? MCV 84.3 05/29/2021  ? PLT 329 05/29/2021  ? ? ?--/--/O POS (03/02 1948)/RI ? ?A/P Post partum day 1. ? Routine care.  Expect d/c routine.   ? ?Daria Pastures ?  ?

## 2021-05-29 NOTE — Discharge Summary (Signed)
? ?  Postpartum Discharge Summary ? ?Date of Service updated  ? ?   ?Patient Name: Dorothy Wells ?DOB: [birth year] ?MRN: [id number] ? ?Date of admission: 05/28/2021 ?Delivery date:05/28/2021  ?Delivering provider: Verita Schneiders A  ?Date of discharge: 05/29/2021 ? ?Admitting diagnosis: Postpartum care following vaginal delivery [Z39.2] ?Intrauterine pregnancy: [redacted]w[redacted]d    ?Secondary diagnosis:  Principal Problem: ?  Postpartum care following vaginal delivery ? ?Additional problems: precipitous delivery in car    ?Discharge diagnosis: Term Pregnancy Delivered                                              ?Post partum procedures: none ?Augmentation:  none ?Complications: None ? ?Hospital course: Onset of Labor With Vaginal Delivery      ?31y.o. yo [id number] 377w3das admitted in Active Labor on 05/28/2021. Patient had an uncomplicated labor course as follows:  ?Membrane Rupture Time/Date:  ,   ?Delivery Method:Vaginal, Spontaneous  ?Episiotomy: None  ?Lacerations:  None  ?Patient had an uncomplicated postpartum course.  She is ambulating, tolerating a regular diet, passing flatus, and urinating well. Patient is discharged home in stable condition on 05/29/21. ? ?Newborn Data: ?Birth date:05/28/2021  ?Birth time:5:52 PM  ?Gender:Female  ?Living status:Living  ?Apgars:9 ,9  ?Weight:2970 g  ? ?Magnesium Sulfate received: No ?BMZ received: No ?Rhophylac:No ?MMR:No ?T-DaP:Given prenatally ?Flu: No ?Transfusion:No ? ?Physical exam  ?Vitals:  ? 05/29/21 0150 05/29/21 0530 05/29/21 0902 05/29/21 1530  ?BP: 118/74 112/72 122/75 122/77  ?Pulse: 88 84 85 81  ?Resp: '16 18 20 18  ' ?Temp: 98 ?F (36.7 ?C) 98.4 ?F (36.9 ?C) 98.6 ?F (37 ?C) 98.6 ?F (37 ?C)  ?TempSrc:  Oral Oral Oral  ?SpO2: 98% 99% 99% 99%  ? ? ?Labs: ?Lab Results  ?Component Value Date  ? WBC 11.2 (H) 05/29/2021  ? HGB 10.1 (L) 05/29/2021  ? HCT 30.7 (L) 05/29/2021  ? MCV 84.3 05/29/2021  ? PLT 329 05/29/2021  ? ?CMP Latest Ref Rng & Units 08/10/2019  ?Glucose 65 - 99 mg/dL  94  ?BUN 6 - 20 mg/dL 12  ?Creatinine 0.57 - 1.00 mg/dL 0.73  ?Sodium 134 - 144 mmol/L 140  ?Potassium 3.5 - 5.2 mmol/L 4.7  ?Chloride 96 - 106 mmol/L 105  ?CO2 20 - 29 mmol/L 22  ?Calcium 8.7 - 10.2 mg/dL 9.4  ?Total Protein 6.0 - 8.5 g/dL 7.0  ?Total Bilirubin 0.0 - 1.2 mg/dL <0.2  ?Alkaline Phos 39 - 117 IU/L 129(H)  ?AST 0 - 40 IU/L 12  ?ALT 0 - 32 IU/L 8  ? ?Edinburgh Score: ?Edinburgh Postnatal Depression Scale Screening Tool 05/29/2021  ?I have been able to laugh and see the funny side of things. 0  ?I have looked forward with enjoyment to things. 0  ?I have blamed myself unnecessarily when things went wrong. 0  ?I have been anxious or worried for no good reason. 0  ?I have felt scared or panicky for no good reason. 0  ?Things have been getting on top of me. 0  ?I have been so unhappy that I have had difficulty sleeping. 0  ?I have felt sad or miserable. 0  ?I have been so unhappy that I have been crying. 0  ?The thought of harming myself has occurred to me. 0  ?Edinburgh Postnatal Depression Scale Total 0  ? ? ? ? ?  After visit meds:  ?Allergies as of 05/29/2021   ?No Known Allergies ?  ? ?  ?Medication List  ?  ? ?TAKE these medications   ? ?multivitamin with minerals tablet ?Take 1 tablet by mouth daily. ?  ? ?  ? ?  ?  ? ? ?  ?Discharge Care Instructions  ?(From admission, onward)  ?  ? ? ?  ? ?  Start     Ordered  ? 05/29/21 0000  Discharge wound care:       ?Comments: Sitz baths and icepacks to perineum.  If stitches, they will dissolve.  ? 05/29/21 1818  ? ?  ?  ? ?  ? ? ? ?Discharge home in stable condition ?Infant Feeding:  ? ?Infant Disposition:home with mother ?Discharge instruction: per After Visit Summary and Postpartum booklet. ?Activity: Advance as tolerated. Pelvic rest for 6 weeks.  ?Diet: routine diet ?Anticipated Birth Control: Unsure ?Postpartum Appointment:4 weeks ?Additional Postpartum F/U:  none ?Future Appointments:No future appointments. ?Follow up Visit: ? Follow-up Information   ? ?  Ob/Gyn, [facility] Follow up in 4 week(s).   ?Contact information: ?[address] ?[place] [address] ?[phone] ? ? ?  ?  ? ?  ?  ? ?  ? ? ? ?  ? ?05/29/2021 ?Daria Pastures, MD ? ? ?

## 2021-05-29 NOTE — Lactation Note (Signed)
This note was copied from a baby's chart. ?Lactation Consultation Note ? ?Patient Name: Dorothy Wells ?Today's Date: 05/29/2021 ?  ?Age:29 hours ?P2, ETI female infant -2% weight loss. ?In house interpreter used # [name]. ?Per mom, she recently BF infant for 20 minutes prior to his bath, mom is doing skin to skin with infant currently. ?Mom would like LC return later to assess next latch, [facility] written name on white board.  ?Maternal Data ?  ? ?Feeding ?  ? ?LATCH Score ?  ? ?  ? ?  ? ?  ? ?  ? ?  ? ? ?Lactation Tools Discussed/Used ?  ? ?Interventions ?  ? ?Discharge ?  ? ?Consult Status ?  ? ? ? ?Danelle Earthly ?05/29/2021, 4:20 PM ? ? ? ?

## 2021-06-08 ENCOUNTER — Telehealth (HOSPITAL_COMMUNITY): Payer: Self-pay | Admitting: *Deleted

## 2021-06-08 NOTE — Telephone Encounter (Signed)
Hospital discharge follow-up call completed with Language Line interpreter, [place], [place]. ?Patient voiced no questions or concerns at this time. EPDS=0. Patient voiced no questions or concerns regarding infant at this time. Patient reports infant sleeps in a crib on his back. RN reviewed ABCs of safe sleep. Patient verbalized understanding. Deforest Hoyles, RN, 06/08/21, [phone]   ?

## 2021-12-07 ENCOUNTER — Ambulatory Visit (INDEPENDENT_AMBULATORY_CARE_PROVIDER_SITE_OTHER): Payer: Commercial Managed Care - HMO | Admitting: Medical

## 2021-12-07 VITALS — BP 105/70 | HR 67 | Temp 98.0°F | Resp 18 | Ht 68.0 in | Wt 153.6 lb

## 2021-12-07 DIAGNOSIS — E875 Hyperkalemia: Secondary | ICD-10-CM | POA: Diagnosis not present

## 2021-12-07 DIAGNOSIS — Z0000 Encounter for general adult medical examination without abnormal findings: Secondary | ICD-10-CM | POA: Diagnosis not present

## 2021-12-07 LAB — CBC WITH DIFFERENTIAL/PLATELET
Basophils Absolute: 0 10*3/uL (ref 0.0–0.1)
Basophils Relative: 0.5 % (ref 0.0–3.0)
Eosinophils Absolute: 0.1 10*3/uL (ref 0.0–0.7)
Eosinophils Relative: 1 % (ref 0.0–5.0)
HCT: 39.2 % (ref 36.0–46.0)
Hemoglobin: 13 g/dL (ref 12.0–15.0)
Lymphocytes Relative: 19.5 % (ref 12.0–46.0)
Lymphs Abs: 1 10*3/uL (ref 0.7–4.0)
MCHC: 33.2 g/dL (ref 30.0–36.0)
MCV: 90.7 fl (ref 78.0–100.0)
Monocytes Absolute: 0.3 10*3/uL (ref 0.1–1.0)
Monocytes Relative: 6 % (ref 3.0–12.0)
Neutro Abs: 3.8 10*3/uL (ref 1.4–7.7)
Neutrophils Relative %: 73 % (ref 43.0–77.0)
Platelets: 292 10*3/uL (ref 150.0–400.0)
RBC: 4.33 Mil/uL (ref 3.87–5.11)
RDW: 13.4 % (ref 11.5–15.5)
WBC: 5.3 10*3/uL (ref 4.0–10.5)

## 2021-12-07 LAB — LIPID PANEL
Cholesterol: 132 mg/dL (ref 0–200)
HDL: 57.9 mg/dL (ref >39.00)
LDL Cholesterol: 63 mg/dL (ref 0–99)
NonHDL: 74.44
Total CHOL/HDL Ratio: 2
Triglycerides: 56 mg/dL (ref 0.0–149.0)
VLDL: 11.2 mg/dL (ref 0.0–40.0)

## 2021-12-07 LAB — COMPREHENSIVE METABOLIC PANEL
ALT: 8 U/L (ref 0–35)
AST: 10 U/L (ref 0–37)
Albumin: 4.3 g/dL (ref 3.5–5.2)
Alkaline Phosphatase: 129 U/L — ABNORMAL HIGH (ref 39–117)
BUN: 12 mg/dL (ref 6–23)
CO2: 28 mEq/L (ref 19–32)
Calcium: 9.6 mg/dL (ref 8.4–10.5)
Chloride: 105 mEq/L (ref 96–112)
Creatinine, Ser: 0.7 mg/dL (ref 0.40–1.20)
GFR: 116.8 mL/min (ref >60.00)
Glucose, Bld: 75 mg/dL (ref 70–99)
Potassium: 5.3 mEq/L — ABNORMAL HIGH (ref 3.5–5.1)
Sodium: 138 mEq/L (ref 135–145)
Total Bilirubin: 0.3 mg/dL (ref 0.2–1.2)
Total Protein: 7.4 g/dL (ref 6.0–8.3)

## 2021-12-07 NOTE — Addendum Note (Signed)
Addended by: Gwenevere Abbot on: 12/07/2021 08:55 PM   Modules accepted: Orders

## 2021-12-07 NOTE — Progress Notes (Signed)
Subjective:    Patient ID: Dorothy Wells, female    DOB: 17-May-1990, 31 y.o.   MRN: [id number]  HPI Pt I in for first time.   Pt [place]. In [place] for 6 years. Works Chartered loss adjuster. Occasional exercise 3 days a week. 2 children. Married.  70 yo and 52 month old child. Pt states no alcohol. No smoking. Coffee once a month.  D and C after spontaneous abortion in 2018.   Pt bp initially low. Her bp normal is 120/70 usually per pt. Not reporting any obvious fatigue/  Lmp- started today.       Review of Systems  Constitutional:  Negative for chills, fatigue and fever.  HENT:  Negative for congestion and ear discharge.   Respiratory:  Negative for cough, chest tightness, shortness of breath and wheezing.   Cardiovascular:  Negative for chest pain and palpitations.  Gastrointestinal:  Negative for abdominal pain.  Genitourinary:  Negative for dysuria, flank pain and frequency.  Musculoskeletal:  Negative for back pain.  Neurological:  Negative for dizziness, seizures, syncope and light-headedness.  Psychiatric/Behavioral:  Negative for behavioral problems, decreased concentration and dysphoric mood.     No past medical history on file.   Social History   Socioeconomic History   Marital status: Married    Spouse name: Not on file   Number of children: Not on file   Years of education: Not on file   Highest education level: Not on file  Occupational History   Not on file  Tobacco Use   Smoking status: Not on file   Smokeless tobacco: Not on file  Substance and Sexual Activity   Alcohol use: Not on file   Drug use: Not on file   Sexual activity: Not on file  Other Topics Concern   Not on file  Social History Narrative   Not on file   Social Determinants of Health   Financial Resource Strain: Not on file  Food Insecurity: Not on file  Transportation Needs: Not on file  Physical Activity: Not on file  Stress: Not on file  Social Connections: Not on file   Intimate Partner Violence: Not on file      No family history on file.  Not on File  No current outpatient medications on file prior to visit.   No current facility-administered medications on file prior to visit.    BP 105/70   Pulse 67   Temp 98 F (36.7 C)   Resp 18   Ht 5\' 8"  (1.727 m)   Wt 153 lb 9.6 oz (69.7 kg)   LMP 12/07/2021   SpO2 99%   BMI 23.35 kg/m        Objective:   Physical Exam General Mental Status- Alert. General Appearance- Not in acute distress.   Skin General: Color- Normal Color. Moisture- Normal Moisture.  Neck Carotid Arteries- Normal color. Moisture- Normal Moisture. No carotid bruits. No JVD.  Chest and Lung Exam Auscultation: Breath Sounds:-Normal.  Cardiovascular Auscultation:Rythm- Regular. Murmurs & Other Heart Sounds:Auscultation of the heart reveals- No Murmurs.  Abdomen Inspection:-Inspeection Normal. Palpation/Percussion:Note:No mass. Palpation and Percussion of the abdomen reveal- Non Tender, Non Distended + BS, no rebound or guarding.   Neurologic Cranial Nerve exam:- CN III-XII intact(No nystagmus), symmetric smile. Strength:- 5/5 equal and symmetric strength both upper and lower extremities.        Assessment & Plan:   Patient Instructions  For you wellness exam today I have ordered cbc, cmp and  lipid  panel.  Flu vaccine declined. If you change your mind can call back and doe as nurse visit.  Up to date on papsmear  Recommend exercise and healthy diet.  We will let you know lab results as they come in.  Follow up date appointment will be determined after lab review.    Lower end bp today. Will see if lab show dehydration or anemia.      Esperanza Richters, PA-C

## 2021-12-07 NOTE — Patient Instructions (Addendum)
For you wellness exam today I have ordered cbc, cmp and  lipid panel.  Flu vaccine declined. If you change your mind can call back and doe as nurse visit.  Up to date on papsmear  Recommend exercise and healthy diet.  We will let you know lab results as they come in.  Follow up date appointment will be determined after lab review.    Lower end bp today. Will see if lab show dehydration or anemia.   Preventive Care 53-29 Years Old, Female Preventive care refers to lifestyle choices and visits with your health care provider that can promote health and wellness. Preventive care visits are also called wellness exams. What can I expect for my preventive care visit? Counseling During your preventive care visit, your health care provider may ask about your: Medical history, including: Past medical problems. Family medical history. Pregnancy history. Current health, including: Menstrual cycle. Method of birth control. Emotional well-being. Home life and relationship well-being. Sexual activity and sexual health. Lifestyle, including: Alcohol, nicotine or tobacco, and drug use. Access to firearms. Diet, exercise, and sleep habits. Work and work Statistician. Sunscreen use. Safety issues such as seatbelt and bike helmet use. Physical exam Your health care provider may check your: Height and weight. These may be used to calculate your BMI (body mass index). BMI is a measurement that tells if you are at a healthy weight. Waist circumference. This measures the distance around your waistline. This measurement also tells if you are at a healthy weight and may help predict your risk of certain diseases, such as type 2 diabetes and high blood pressure. Heart rate and blood pressure. Body temperature. Skin for abnormal spots. What immunizations do I need?  Vaccines are usually given at various ages, according to a schedule. Your health care provider will recommend vaccines for you based on  your age, medical history, and lifestyle or other factors, such as travel or where you work. What tests do I need? Screening Your health care provider may recommend screening tests for certain conditions. This may include: Pelvic exam and Pap test. Lipid and cholesterol levels. Diabetes screening. This is done by checking your blood sugar (glucose) after you have not eaten for a while (fasting). Hepatitis B test. Hepatitis C test. HIV (human immunodeficiency virus) test. STI (sexually transmitted infection) testing, if you are at risk. BRCA-related cancer screening. This may be done if you have a family history of breast, ovarian, tubal, or peritoneal cancers. Talk with your health care provider about your test results, treatment options, and if necessary, the need for more tests. Follow these instructions at home: Eating and drinking  Eat a healthy diet that includes fresh fruits and vegetables, whole grains, lean protein, and low-fat dairy products. Take vitamin and mineral supplements as recommended by your health care provider. Do not drink alcohol if: Your health care provider tells you not to drink. You are pregnant, may be pregnant, or are planning to become pregnant. If you drink alcohol: Limit how much you have to 0-1 drink a day. Know how much alcohol is in your drink. In the [place] one drink equals one 12 oz bottle of beer (355 mL), one 5 oz glass of wine (148 mL), or one 1 oz glass of hard liquor (44 mL). Lifestyle Brush your teeth every morning and night with fluoride toothpaste. Floss one time each day. Exercise for at least 30 minutes 5 or more days each week. Do not use any products that contain nicotine or tobacco.  These products include cigarettes, chewing tobacco, and vaping devices, such as e-cigarettes. If you need help quitting, ask your health care provider. Do not use drugs. If you are sexually active, practice safe sex. Use a condom or other form of protection to  prevent STIs. If you do not wish to become pregnant, use a form of birth control. If you plan to become pregnant, see your health care provider for a prepregnancy visit. Find healthy ways to manage stress, such as: Meditation, yoga, or listening to music. Journaling. Talking to a trusted person. Spending time with friends and family. Minimize exposure to UV radiation to reduce your risk of skin cancer. Safety Always wear your seat belt while driving or riding in a vehicle. Do not drive: If you have been drinking alcohol. Do not ride with someone who has been drinking. If you have been using any mind-altering substances or drugs. While texting. When you are tired or distracted. Wear a helmet and other protective equipment during sports activities. If you have firearms in your house, make sure you follow all gun safety procedures. Seek help if you have been physically or sexually abused. What's next? Go to your health care provider once a year for an annual wellness visit. Ask your health care provider how often you should have your eyes and teeth checked. Stay up to date on all vaccines. This information is not intended to replace advice given to you by your health care provider. Make sure you discuss any questions you have with your health care provider. Document Revised: 09/10/2020 Document Reviewed: 09/10/2020 Elsevier Patient Education  [place].

## 2021-12-21 ENCOUNTER — Other Ambulatory Visit (INDEPENDENT_AMBULATORY_CARE_PROVIDER_SITE_OTHER): Payer: Commercial Managed Care - HMO

## 2021-12-21 DIAGNOSIS — E875 Hyperkalemia: Secondary | ICD-10-CM | POA: Diagnosis not present

## 2021-12-21 LAB — COMPREHENSIVE METABOLIC PANEL
ALT: 8 U/L (ref 0–35)
AST: 9 U/L (ref 0–37)
Albumin: 4.3 g/dL (ref 3.5–5.2)
Alkaline Phosphatase: 137 U/L — ABNORMAL HIGH (ref 39–117)
BUN: 15 mg/dL (ref 6–23)
CO2: 25 mEq/L (ref 19–32)
Calcium: 9.2 mg/dL (ref 8.4–10.5)
Chloride: 105 mEq/L (ref 96–112)
Creatinine, Ser: 0.59 mg/dL (ref 0.40–1.20)
GFR: 121.68 mL/min (ref >60.00)
Glucose, Bld: 76 mg/dL (ref 70–99)
Potassium: 4.3 mEq/L (ref 3.5–5.1)
Sodium: 138 mEq/L (ref 135–145)
Total Bilirubin: 0.3 mg/dL (ref 0.2–1.2)
Total Protein: 7.4 g/dL (ref 6.0–8.3)

## 2022-01-20 ENCOUNTER — Telehealth: Payer: Self-pay | Admitting: Medical

## 2022-01-20 MED ORDER — OSELTAMIVIR PHOSPHATE 75 MG PO CAPS: 75.0000 mg | ORAL_CAPSULE | Freq: Every day | ORAL | 0 refills | Status: DC

## 2022-01-20 NOTE — Telephone Encounter (Addendum)
Patient states her daughter tested positive for the flu at her pediatrician, so she would like medication sent to her pharmacy for prevention.    [id number] [address], [place] [address] Phone: [phone]  Fax: [phone]   Sent in tamfilu for prevention. But let patient know to read packet insert on potential side effects. Pass same message to her husband. Also for [place] want to know if pregnancy or not before starting. Is she late for menses? Trying to help them out but want to know this first as well.

## 2022-01-20 NOTE — Telephone Encounter (Signed)
Spoke to patient and she is aware medication was sent.

## 2022-02-23 ENCOUNTER — Encounter: Payer: Self-pay | Admitting: Internal Medicine

## 2022-02-23 ENCOUNTER — Ambulatory Visit (INDEPENDENT_AMBULATORY_CARE_PROVIDER_SITE_OTHER): Payer: Commercial Managed Care - HMO | Admitting: Internal Medicine

## 2022-02-23 VITALS — BP 112/80 | HR 76 | Temp 98.1°F | Resp 16 | Ht 68.0 in | Wt 154.2 lb

## 2022-02-23 DIAGNOSIS — H6502 Acute serous otitis media, left ear: Secondary | ICD-10-CM

## 2022-02-23 NOTE — Progress Notes (Signed)
   Subjective:    Patient ID: Dorothy Wells, female    DOB: 11/22/1990, 31 y.o.   MRN: [id number]  DOS:  02/23/2022 Type of visit - description: acute  Symptoms started 3 days ago. Her left ear started "popping".  Then she developed some pain. Denies any URI symptoms such as runny nose or sore throat. No nasal congestion. No fever or chills. No cough.   Review of Systems See above   Past Medical History:  Diagnosis Date   Vaginal Pap smear, abnormal     Past Surgical History:  Procedure Laterality Date   COLPOSCOPY     DILATION AND CURETTAGE OF UTERUS  2018   miscarriage   LEEP      Current Outpatient Medications  Medication Instructions   FOLIC ACID PO Oral   Multiple Vitamins-Minerals (MULTIVITAMIN WITH MINERALS) tablet 1 tablet, Daily   oseltamivir (TAMIFLU) 75 mg, Oral, Daily   Prenat MV-Min w/Fe-Folate-DHA (PRENATAL COMPLETE PO) Oral       Objective:   Physical Exam BP 112/80   Pulse 76   Temp 98.1 F (36.7 C) (Oral)   Resp 16   Ht 5\' 8"  (1.727 m)   Wt 154 lb 4 oz (70 kg)   LMP 02/08/2022 (Exact Date)   SpO2 98%   Breastfeeding No   BMI 23.45 kg/m  General:   Well developed, NAD, BMI noted. HEENT:  Normocephalic . Face symmetric, atraumatic. Right TM: Normal Left TM: Partially obscured by wax but TM looks normal.  Canal is normal with no swelling. TMJ: No click, no TTP. Lungs:  CTA B Normal respiratory effort, no intercostal retractions, no accessory muscle use. Heart: RRR,  no murmur.  Lower extremities: no pretibial edema bilaterally  Skin: Not pale. Not jaundice Neurologic:  alert & oriented X3.  Speech normal, gait appropriate for age and unassisted Psych--  Cognition and judgment appear intact.  Cooperative with normal attention span and concentration.  Behavior appropriate. No anxious or depressed appearing.      Assessment    Serous otitis? Symptoms as described above, exam is essentially normal, serous otitis?. Plan:  Flonase daily for few days, Allegra 60 twice daily as needed for few days. Call if not gradually better.

## 2022-03-05 IMAGING — US US PELVIS COMPLETE WITH TRANSVAGINAL
1 series · 13 of 25 positions shown · non-contrast
Comparison: None

CLINICAL DATA: Right-sided pelvic pain for 2 weeks. LMP 08/08/2019.

EXAM:
TRANSABDOMINAL AND TRANSVAGINAL ULTRASOUND OF PELVIS
TECHNIQUE: Both transabdominal and transvaginal ultrasound examinations of the
pelvis were performed. Transabdominal technique was performed for
global imaging of the pelvis including uterus, ovaries, adnexal
regions, and pelvic cul-de-sac. It was necessary to proceed with
endovaginal exam following the transabdominal exam to visualize the
endometrium and ovaries.

[Series 1: us pelvis complete with transvaginal · 0.31mm/px · 13 of 73 slices shown]
[im 1/73]
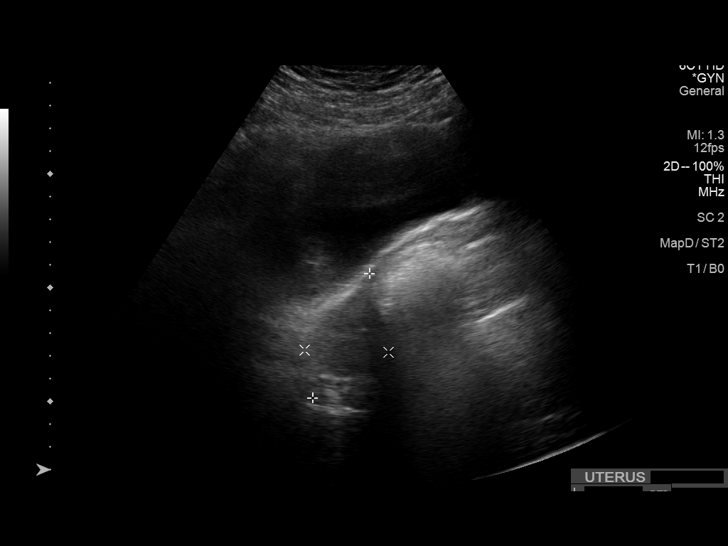
[im 7/73]
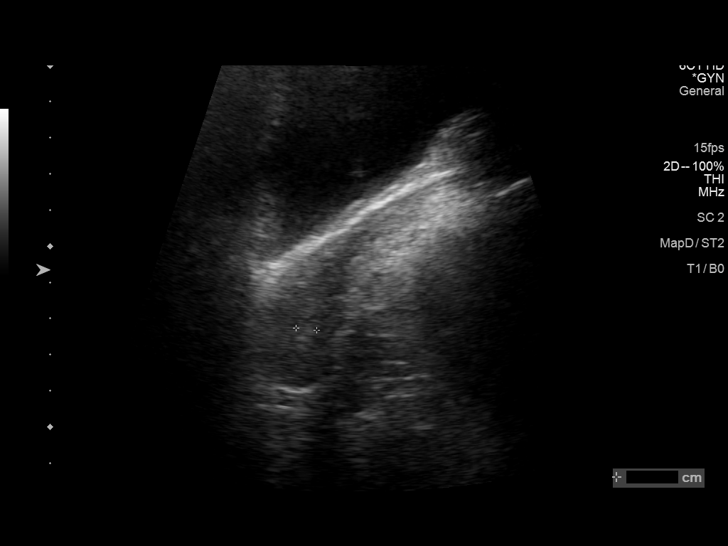
[im 13/73]
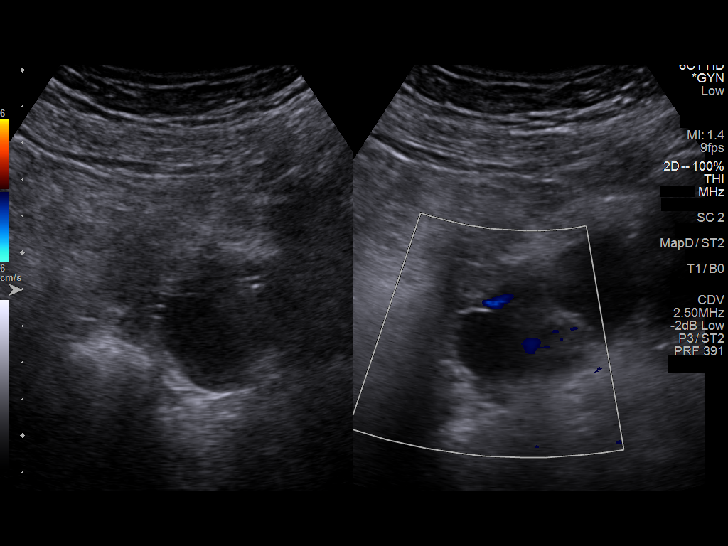
[im 19/73]
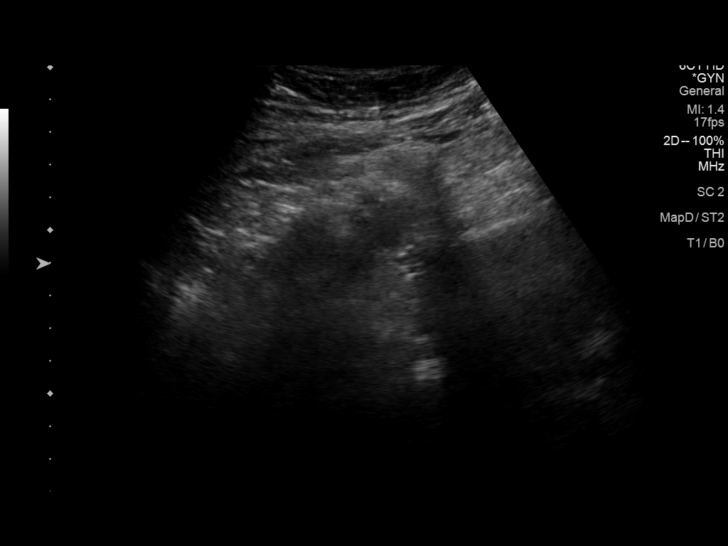
[im 25/73]
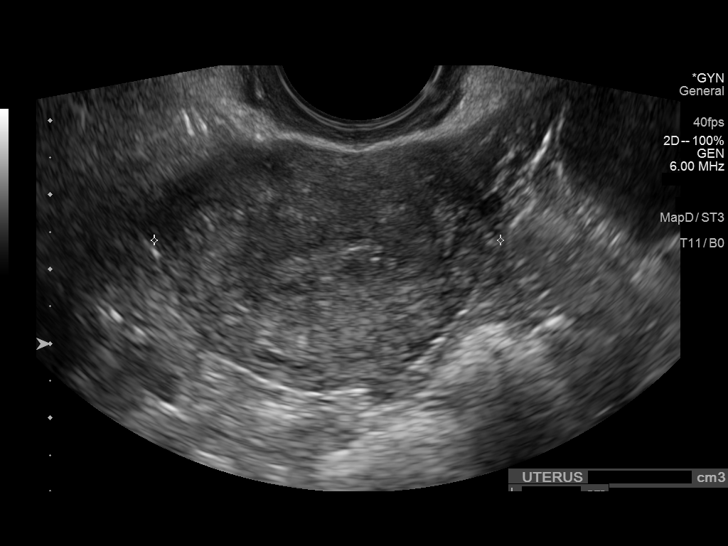
[im 31/73]
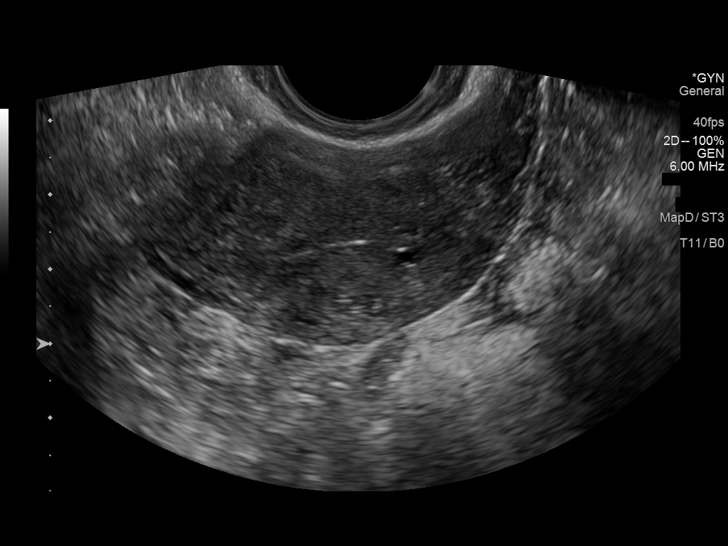
[im 37/73]
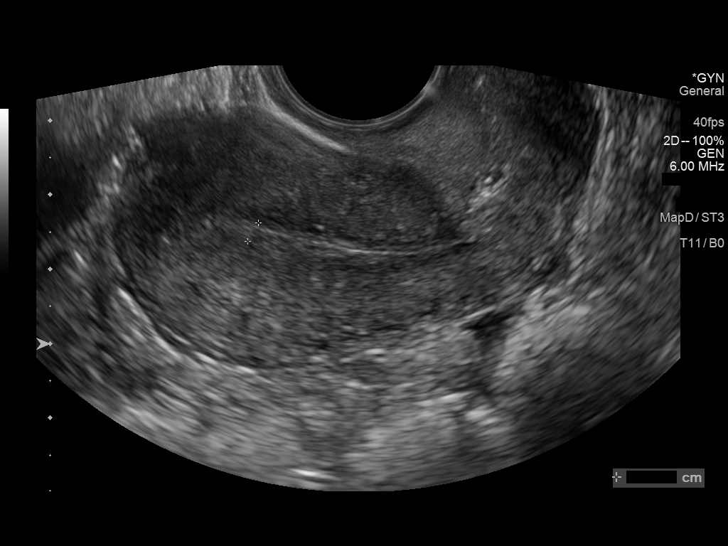
[im 43/73]
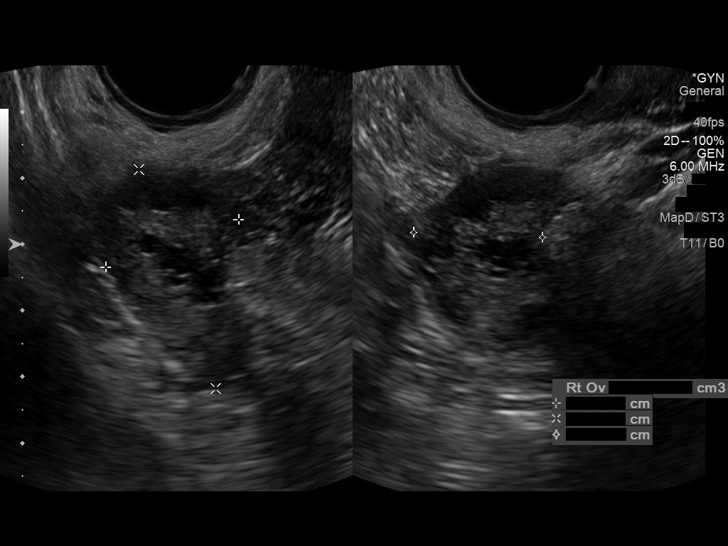
[im 49/73]
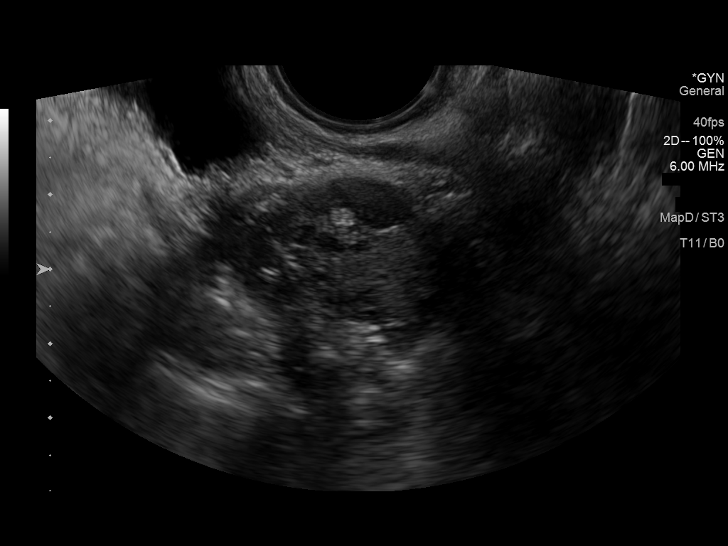
[im 55/73]
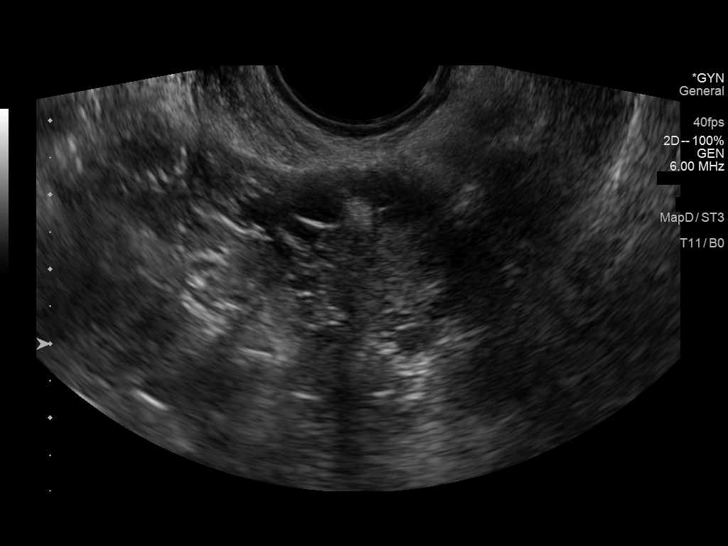
[im 61/73]
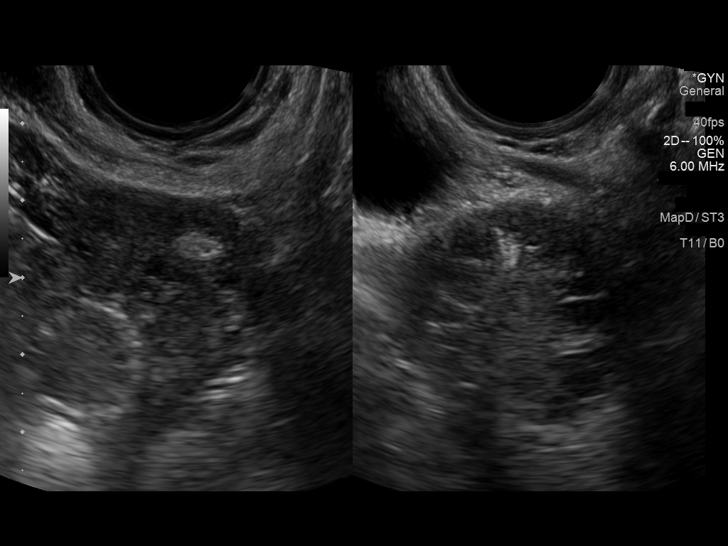
[im 67/73]
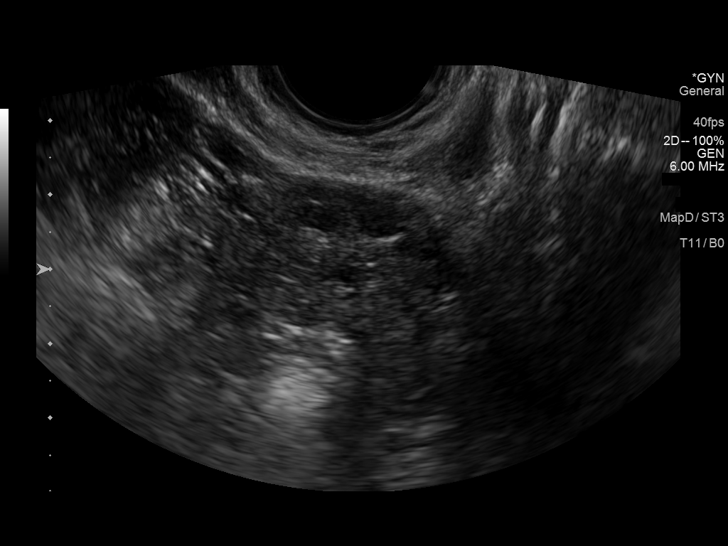
[im 73/73]
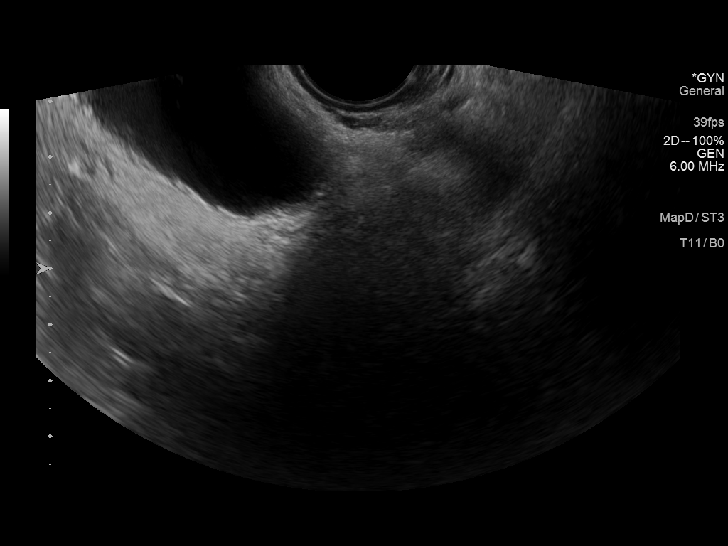

[13 of 25 positions shown; findings below may reference images not displayed]

FINDINGS: Uterus

Measurements: 7.0 x 3.6 x 4.7 cm = volume: 61 mL. No fibroids or
other mass visualized.

Endometrium

Thickness: 3 mm.  No focal abnormality visualized.

Right ovary

Measurements: 2.1 x 3.5 x 2.0 cm = volume: 7.6 mL. Normal
appearance/no adnexal mass.

Left ovary

Measurements: 2.3 x 3.2 x 2.0 cm = volume: 7.4 mL. A tiny
hyperechoic lesion is seen which measures 6 x 6 x 3 mm, and shows no
acoustic shadowing. A simple paraovarian cyst is also seen adjacent
to the ovary which measures 3.2 x 1.8 x 2.4 cm.

Other findings

No abnormal free fluid.
IMPRESSION: 3.2 cm benign-appearing left paraovarian cyst.

6 mm hyperechoic lesion in left ovary. Differential diagnosis
includes corpus albicans and tiny ovarian dermoid.

Normal appearance of uterus and right ovary.

## 2022-07-23 ENCOUNTER — Ambulatory Visit: Payer: 59 | Admitting: Medical

## 2022-07-23 VITALS — BP 120/72 | HR 70 | Resp 18 | Ht 68.0 in | Wt 159.6 lb

## 2022-07-23 DIAGNOSIS — N926 Irregular menstruation, unspecified: Secondary | ICD-10-CM | POA: Diagnosis not present

## 2022-07-23 DIAGNOSIS — Z8742 Personal history of other diseases of the female genital tract: Secondary | ICD-10-CM | POA: Diagnosis not present

## 2022-07-23 DIAGNOSIS — R109 Unspecified abdominal pain: Secondary | ICD-10-CM | POA: Diagnosis not present

## 2022-07-23 LAB — POCT URINALYSIS DIPSTICK
Bilirubin, UA: NEGATIVE
Blood, UA: NEGATIVE
Glucose, UA: NEGATIVE
Ketones, UA: NEGATIVE
Leukocytes, UA: NEGATIVE
Nitrite, UA: NEGATIVE
Protein, UA: NEGATIVE
Spec Grav, UA: <=1.005 — AB (ref 1.010–1.025)
Urobilinogen, UA: 0.2 E.U./dL
pH, UA: 5 (ref 5.0–8.0)

## 2022-07-23 LAB — POCT URINE PREGNANCY: Preg Test, Ur: NEGATIVE

## 2022-07-23 NOTE — Patient Instructions (Addendum)
Mild occasional left adnexal area pain, hx of remote hpv 2012 and one irregular cycle in January. Urine preg test negative and urine clear. Refer to gynecologist for probable repeat US. If pending appointment pain mild use iburofen. But if worse pain can order [place] thru radiology dept.  Follow up wellness exam September or sooner if needed.

## 2022-07-23 NOTE — Progress Notes (Signed)
Subjective:    Patient ID: Dorothy Wells, female    DOB: [birth year], 32 y.o.   MRN: [id number]  HPI  Pt states pt had irregular menses. She states some bleeding between cycles only in January. . Pt states this started in December. Last cycle was July 03, 2022. She states normal flow for 5 day. No bleeding or spotting since.   Pt states all her life since menses started she never had irregular cycle.   Pt has 2 children. Not on any ocp recently. Was on ocp  2012- 2017.   Pt not trying to get pregnancy.  Last pap was August 2023. Remote hx of hpv in 2013.   Pt states sometimes will get adnexal area pain intemittently. More so this month. Sometimes will get on left side but not as intense. At tis times pain level 1/10. She states this month pain was 5/10.   On review pt has had in 08-17-2019  IMPRESSION: 3.2 cm benign-appearing left paraovarian cyst.   6 mm hyperechoic lesion in left ovary. Differential diagnosis includes corpus albicans and tiny ovarian dermoid.   Normal appearance of uterus and right ovary.    Review of Systems  Constitutional:  Negative for chills, fatigue and fever.  Respiratory:  Negative for cough, chest tightness, shortness of breath and wheezing.   Cardiovascular:  Negative for chest pain and palpitations.  Gastrointestinal:  Negative for abdominal pain, constipation, diarrhea and nausea.  Genitourinary:        See hpi.  Musculoskeletal:  Negative for back pain, gait problem and joint swelling.  Neurological:  Negative for dizziness and headaches.  Hematological:  Negative for adenopathy. Does not bruise/bleed easily.  Psychiatric/Behavioral:  Negative for behavioral problems, decreased concentration and dysphoric mood.     Past Medical History:  Diagnosis Date   Vaginal Pap smear, abnormal      Social History   Socioeconomic History   Marital status: Married    Spouse name: Not on file   Number of children: Not on file   Years of  education: Not on file   Highest education level: GED or equivalent  Occupational History   Not on file  Tobacco Use   Smoking status: Never   Smokeless tobacco: Never  Vaping Use   Vaping Use: Never used  Substance and Sexual Activity   Alcohol use: Never   Drug use: Never   Sexual activity: Yes  Other Topics Concern   Not on file  Social History Narrative   Not on file   Social Determinants of Health   Financial Resource Strain: Low Risk  (07/23/2022)   Overall Financial Resource Strain (CARDIA)    Difficulty of Paying Living Expenses: Not hard at all  Food Insecurity: No Food Insecurity (07/23/2022)   Hunger Vital Sign    Worried About Running Out of Food in the Last Year: Never true    Ran Out of Food in the Last Year: Never true  Transportation Needs: No Transportation Needs (07/23/2022)   PRAPARE - Administrator, Civil Service (Medical): No    Lack of Transportation (Non-Medical): No  Physical Activity: Insufficiently Active (07/23/2022)   Exercise Vital Sign    Days of Exercise per Week: 2 days    Minutes of Exercise per Session: 40 min  Stress: No Stress Concern Present (07/23/2022)   [employer] of Occupational Health - Occupational Stress Questionnaire    Feeling of Stress : Not at all  Social Connections: Moderately Isolated (  07/23/2022)   Social Connection and Isolation Panel [NHANES]    Frequency of Communication with Friends and Family: Once a week    Frequency of Social Gatherings with Friends and Family: Once a week    Attends Religious Services: More than 4 times per year    Active Member of [employer] or Organizations: No    Attends Engineer, structural: Not on file    Marital Status: Married  Catering manager Violence: Not on file    Past Surgical History:  Procedure Laterality Date   COLPOSCOPY     DILATION AND CURETTAGE OF UTERUS  2018   miscarriage   LEEP      Family History  Problem Relation Age of Onset   Arthritis  Mother    Hypertension Maternal Grandfather    Hypertension Paternal Grandfather     No Known Allergies  Current Outpatient Medications on File Prior to Visit  Medication Sig Dispense Refill   FOLIC ACID PO Take by mouth.     Prenat MV-Min w/Fe-Folate-DHA (PRENATAL COMPLETE PO) Take by mouth.     No current facility-administered medications on file prior to visit.    BP 120/72   Pulse 70   Resp 18   Ht 5\' 8"  (1.727 m)   Wt 159 lb 9.6 oz (72.4 kg)   LMP 07/03/2022   SpO2 100%   BMI 24.27 kg/m        Objective:   Physical Exam   General- No acute distress. Pleasant patient. Neck- Full range of motion, no jvd Lungs- Clear, even and unlabored. Heart- regular rate and rhythm. Neurologic- CNII- XII grossly intact.  Abdomen- soft, nd, +bs, no rebound, no guarding. Only faint left lower quadrant adnexal tenderness.      Assessment & Plan:   Patient Instructions  Mild occasional left adnexal area pain, hx of remote hpv 2012 and one irregular cycle in January. Urine preg test negative and urine clear. Refer to gynecologist for probable repeat US. If pending appointment pain mild use iburofen. But if worse pain can order [place] thru radiology dept.  Follow up wellness exam September or sooner if needed.   Esperanza Richters, PA-C

## 2022-11-08 ENCOUNTER — Other Ambulatory Visit: Payer: Self-pay

## 2022-11-08 ENCOUNTER — Ambulatory Visit (INDEPENDENT_AMBULATORY_CARE_PROVIDER_SITE_OTHER): Payer: 59

## 2022-11-08 VITALS — BP 121/74 | HR 103

## 2022-11-08 DIAGNOSIS — O3680X0 Pregnancy with inconclusive fetal viability, not applicable or unspecified: Secondary | ICD-10-CM

## 2022-11-08 DIAGNOSIS — Z3200 Encounter for pregnancy test, result unknown: Secondary | ICD-10-CM

## 2022-11-08 DIAGNOSIS — N939 Abnormal uterine and vaginal bleeding, unspecified: Secondary | ICD-10-CM

## 2022-11-08 DIAGNOSIS — Z3201 Encounter for pregnancy test, result positive: Secondary | ICD-10-CM | POA: Diagnosis not present

## 2022-11-08 DIAGNOSIS — Z3A01 Less than 8 weeks gestation of pregnancy: Secondary | ICD-10-CM

## 2022-11-08 LAB — POCT PREGNANCY, URINE: Preg Test, Ur: POSITIVE — AB

## 2022-11-08 NOTE — Progress Notes (Signed)
Possible Pregnancy  Here today for pregnancy confirmation. Seen by [facility] providers in [facility] during last pregnancy in 2023 so will not need to reestablish care. UPT in office today is positive. Menstrual cycle usually 28 days. Reports a few menstrual cycles January to March during period of stress that were 35 days. Reviewed dating with patient:   LMP: 09/26/22 (sure) EDD: 07/03/22      6w 1d today  OB history reviewed; 2 prior vaginal births and 1 SAB. Pt strongly desires 1st trimester [place] to confirm the location of pregnancy and dating. Has previously completed NT for screening. Reviewed we offer NIPS screening and can discuss further at new OB visit. She denies any pain. Reports single episode of brown spotting today. Describes left lower abdominal pressure. Reviewed with Briscoe Deutscher, MD who gives verbal order for [place] due to vaginal spotting. Reviewed medications with patient; currently taking prenatal vitamin. Plans to schedule OB visits during check out.  Marjo Bicker, RN 11/08/2022  1:34 PM

## 2022-11-15 ENCOUNTER — Other Ambulatory Visit: Payer: Self-pay

## 2022-11-15 ENCOUNTER — Ambulatory Visit (INDEPENDENT_AMBULATORY_CARE_PROVIDER_SITE_OTHER): Payer: 59

## 2022-11-15 DIAGNOSIS — Z3A01 Less than 8 weeks gestation of pregnancy: Secondary | ICD-10-CM | POA: Diagnosis not present

## 2022-11-15 DIAGNOSIS — N939 Abnormal uterine and vaginal bleeding, unspecified: Secondary | ICD-10-CM

## 2022-11-15 DIAGNOSIS — O3680X0 Pregnancy with inconclusive fetal viability, not applicable or unspecified: Secondary | ICD-10-CM

## 2022-11-15 DIAGNOSIS — Z3481 Encounter for supervision of other normal pregnancy, first trimester: Secondary | ICD-10-CM | POA: Diagnosis not present

## 2022-12-07 ENCOUNTER — Telehealth (INDEPENDENT_AMBULATORY_CARE_PROVIDER_SITE_OTHER): Payer: 59

## 2022-12-07 DIAGNOSIS — Z87410 Personal history of cervical dysplasia: Secondary | ICD-10-CM

## 2022-12-07 DIAGNOSIS — R87629 Unspecified abnormal cytological findings in specimens from vagina: Secondary | ICD-10-CM | POA: Insufficient documentation

## 2022-12-07 DIAGNOSIS — Z3480 Encounter for supervision of other normal pregnancy, unspecified trimester: Secondary | ICD-10-CM

## 2022-12-07 NOTE — Progress Notes (Signed)
New OB Intake  I connected with Dorothy Wells  on 12/07/22 at 11:15 AM EDT by MyChart Video Visit and verified that I am speaking with the correct person using two identifiers. Nurse is located at [facility] and pt is located at home.  I discussed the limitations, risks, security and privacy concerns of performing an evaluation and management service by telephone and the availability of in person appointments. I also discussed with the patient that there may be a patient responsible charge related to this service. The patient expressed understanding and agreed to proceed.  I explained I am completing New OB Intake today. We discussed EDD of 07/13/2023, by Ultrasound. Pt is [id number]. I reviewed her allergies, medications and Medical/Surgical/OB history.    Patient Active Problem List   Diagnosis Date Noted   Supervision of other normal pregnancy, antepartum 12/07/2022   Vaginal Pap smear, abnormal      Concerns addressed today -Patient had an abnormal pap smear year of 2012. LEEP procedure was also done 2012.  -Per patient she gets pap smear every 6 months or every year. Last pap was 2022 done at [facility]. Record is in chart.   Delivery Plans Plans to deliver at [facility]. Discussed the nature of our practice with multiple providers including residents and students. Due to the size of the practice, the delivering provider may not be the same as those providing prenatal care.   Patient is not interested in water birth. Offered upcoming OB visit with CNM to discuss further.  MyChart/Babyscripts MyChart access verified. I explained pt will have some visits in office and some virtually. Babyscripts instructions given and order placed. Patient verifies receipt of registration text/e-mail. Account successfully created and app downloaded.  Blood Pressure Cuff/Weight Scale Patient has private insurance; instructed to purchase blood pressure cuff and bring to first prenatal appt.  Explained after first prenatal appt pt will check weekly and document in Babyscripts. Patient does not have weight scale; declines to track weight weekly in Babyscripts.  Anatomy US Explained first scheduled [place] will be around 19 weeks. Anatomy US scheduled for 02/21/2023 at 8:15.  Is patient a CenteringPregnancy candidate?  Declined Declined due to Childcare   Is patient a Mom+Baby Combined Care candidate?  Not a candidate   If accepted, confirm patient does not intend to move from the area for at least 12 months, then notify Mom+Baby staff  Interested in [place]? If yes, send referral and doula dot phrase.   Is patient a candidate for Babyscripts Optimization? Yes  First visit review I reviewed new OB appt with patient. Explained pt will be seen by Sue Lush, FNP at first visit. Discussed Avelina Laine genetic screening with patient. Patient will get both Panorama and Horizon,initial OB labs, GC/CH,hemoglobin A1C. Routine prenatal labs is not   Last Pap Last Pap smear 2022. Record is in her chart.   Vidal Schwalbe, [place] 12/07/2022  11:29 AM

## 2022-12-13 ENCOUNTER — Encounter: Payer: 59 | Admitting: Obstetrics & Gynecology

## 2022-12-13 ENCOUNTER — Encounter: Payer: 59 | Admitting: Obstetrics and Gynecology

## 2022-12-23 ENCOUNTER — Inpatient Hospital Stay (HOSPITAL_COMMUNITY)
Admission: AD | Admit: 2022-12-23 | Discharge: 2022-12-23 | Disposition: A | Discharge status: Home / Self Care | Payer: 59 | Attending: Family Medicine | Admitting: Family Medicine

## 2022-12-23 DIAGNOSIS — R87629 Unspecified abnormal cytological findings in specimens from vagina: Secondary | ICD-10-CM

## 2022-12-23 DIAGNOSIS — O200 Threatened abortion: Secondary | ICD-10-CM | POA: Insufficient documentation

## 2022-12-23 DIAGNOSIS — Z3A11 11 weeks gestation of pregnancy: Secondary | ICD-10-CM | POA: Diagnosis not present

## 2022-12-23 DIAGNOSIS — M549 Dorsalgia, unspecified: Secondary | ICD-10-CM | POA: Diagnosis present

## 2022-12-23 DIAGNOSIS — R519 Headache, unspecified: Secondary | ICD-10-CM | POA: Diagnosis present

## 2022-12-23 DIAGNOSIS — Z3480 Encounter for supervision of other normal pregnancy, unspecified trimester: Secondary | ICD-10-CM

## 2022-12-23 LAB — URINALYSIS, ROUTINE W REFLEX MICROSCOPIC
Bilirubin Urine: NEGATIVE
Glucose, UA: NEGATIVE mg/dL
Ketones, ur: 5 mg/dL — AB
Leukocytes,Ua: NEGATIVE
Nitrite: NEGATIVE
Protein, ur: NEGATIVE mg/dL
Specific Gravity, Urine: 1.008 (ref 1.005–1.030)
pH: 6 (ref 5.0–8.0)

## 2022-12-23 LAB — WET PREP, GENITAL
Clue Cells Wet Prep HPF POC: NONE SEEN
Sperm: NONE SEEN
Trich, Wet Prep: NONE SEEN
WBC, Wet Prep HPF POC: <10 (ref <10)
Yeast Wet Prep HPF POC: NONE SEEN

## 2022-12-23 NOTE — MAU Note (Signed)
.  Dorothy Wells is a 32 y.o. at [redacted]w[redacted]d here in [facility]U reporting: c/o back pain that started earlier today. Lay down for a little bit and then got up to go to BR and had a small spot of blood on tissue . Still having back pain and now c/o slight headache as well. Denies any recent intercourse   LMP:  Onset of complaint: today Pain score: 3 Vitals:   12/23/22 1753  BP: 132/74  Pulse: 95  Resp: 18  Temp: 98.3 F (36.8 C)     FHT:152 Lab orders placed from triage:  u/a, wet prep,gc

## 2022-12-23 NOTE — MAU Provider Note (Signed)
Faculty Practice OB/GYN Attending MAU Note  Chief Complaint: Back Pain and Vaginal Bleeding  Spanish interpreter: Erie Noe used    Event Date/Time   First Provider Initiated Contact with Patient 12/23/22 1935      SUBJECTIVE Dorothy Wells is a 32 y.o. [id number] at [redacted]w[redacted]d by LMP who presents with vaginal bleeding. Has h/o SAB @ 8 weeks. IUP previously noted on u/s 11/19/2022 (report reviewed). + FH tones on doppler. Also notes some low back pain. She denies recent intercourse or vaginal discharge.  Past Medical History:  Diagnosis Date   Vaginal Pap smear, abnormal    OB History  Gravida Para Term Preterm AB Living  4 2 2  0 1 2  SAB IAB Ectopic Multiple Live Births  1 0 0 0 2    # Outcome Date GA Lbr Len/2nd Weight Sex Type Anes PTL Lv  4 Current           3 Term 05/28/21 [redacted]w[redacted]d   M Vag-Spont None  LIV  2 Term 06/05/17 [redacted]w[redacted]d   M Vag-Spont EPI  LIV  1 SAB 2018           Past Surgical History:  Procedure Laterality Date   COLPOSCOPY     DILATION AND CURETTAGE OF UTERUS  2018   miscarriage   LEEP     Social History   Socioeconomic History   Marital status: Married    Spouse name: Not on file   Number of children: Not on file   Years of education: Not on file   Highest education level: GED or equivalent  Occupational History   Not on file  Tobacco Use   Smoking status: Never   Smokeless tobacco: Never  Vaping Use   Vaping status: Never Used  Substance and Sexual Activity   Alcohol use: Never   Drug use: Never   Sexual activity: Yes  Other Topics Concern   Not on file  Social History Narrative   Not on file   Social Determinants of Health   Financial Resource Strain: Low Risk  (07/23/2022)   Overall Financial Resource Strain (CARDIA)    Difficulty of Paying Living Expenses: Not hard at all  Food Insecurity: No Food Insecurity (07/23/2022)   Hunger Vital Sign    Worried About Running Out of Food in the Last Year: Never true    Ran Out of Food in the  Last Year: Never true  Transportation Needs: No Transportation Needs (07/23/2022)   PRAPARE - Administrator, Civil Service (Medical): No    Lack of Transportation (Non-Medical): No  Physical Activity: Insufficiently Active (07/23/2022)   Exercise Vital Sign    Days of Exercise per Week: 2 days    Minutes of Exercise per Session: 40 min  Stress: No Stress Concern Present (07/23/2022)   [employer] of Occupational Health - Occupational Stress Questionnaire    Feeling of Stress : Not at all  Social Connections: Moderately Isolated (07/23/2022)   Social Connection and Isolation Panel [NHANES]    Frequency of Communication with Friends and Family: Once a week    Frequency of Social Gatherings with Friends and Family: Once a week    Attends Religious Services: More than 4 times per year    Active Member of [employer] or Organizations: No    Attends Banker Meetings: Not on file    Marital Status: Married  Intimate Partner Violence: Unknown (07/03/2021)   Received from [facility]   HITS  Physically Hurt: Not on file    Insult or Talk Down To: Not on file    Threaten Physical Harm: Not on file    Scream or Curse: Not on file   No current facility-administered medications on file prior to encounter.   Current Outpatient Medications on File Prior to Encounter  Medication Sig Dispense Refill   Prenat MV-Min w/Fe-Folate-DHA (PRENATAL COMPLETE PO) Take by mouth.     No Known Allergies  ROS: Pertinent items in HPI  OBJECTIVE BP 132/74   Pulse 95   Temp 98.3 F (36.8 C)   Resp 18   Ht 5\' 8"  (1.727 m)   Wt 74.4 kg   LMP 09/26/2022 (Exact Date)   BMI 24.94 kg/m  CONSTITUTIONAL: Well-developed, well-nourished female in no acute distress.  HENT:  Normocephalic, atraumatic, External right and left ear normal.  EYES: Conjunctivae and EOM are normal. . No scleral icterus.  NECK: Normal range of motion, supple, no masses.  Normal thyroid.  SKIN:  Skin is warm and dry. No rash noted. Not diaphoretic. No erythema. No pallor. NEUROLGIC: Alert and oriented to person, place, and time.  CARDIOVASCULAR: Normal heart rate noted RESPIRATORY: Effort and breath sounds normal, no problems with respiration noted. ABDOMEN: Soft, normal bowel sounds, no distention noted.  No tenderness, rebound or guarding.  PELVIC: Normal appearing external genitalia; normal appearing vaginal mucosa and cervix.  Dark brown bloody discharge noted.  12 week uterine size, no other palpable masses, no uterine or adnexal tenderness. Cervix is FTP externally, but closed internally MUSCULOSKELETAL: Normal range of motion. No tenderness.  No cyanosis, clubbing, or edema.  2+ distal pulses.  LAB RESULTS Results for orders placed or performed during the hospital encounter of 12/23/22 (from the past 48 hour(s))  Urinalysis, Routine w reflex microscopic -Urine, Clean Catch     Status: Abnormal   Collection Time: 12/23/22  6:00 PM  Result Value Ref Range   Color, Urine STRAW (A) YELLOW   APPearance CLEAR CLEAR   Specific Gravity, Urine 1.008 1.005 - 1.030   pH 6.0 5.0 - 8.0   Glucose, UA NEGATIVE NEGATIVE mg/dL   Hgb urine dipstick MODERATE (A) NEGATIVE   Bilirubin Urine NEGATIVE NEGATIVE   Ketones, ur 5 (A) NEGATIVE mg/dL   Protein, ur NEGATIVE NEGATIVE mg/dL   Nitrite NEGATIVE NEGATIVE   Leukocytes,Ua NEGATIVE NEGATIVE   RBC / HPF 0-5 0 - 5 RBC/hpf   WBC, UA 0-5 0 - 5 WBC/hpf   Bacteria, UA RARE (A) NONE SEEN   Squamous Epithelial / HPF 6-10 0 - 5 /HPF   Mucus PRESENT     Comment: Performed at [facility], [address], [place], [place] [address]  Wet prep, genital     Status: None   Collection Time: 12/23/22  6:00 PM   Specimen: PATH Cytology Cervicovaginal Ancillary Only  Result Value Ref Range   Yeast Wet Prep HPF POC NONE SEEN NONE SEEN   Trich, Wet Prep NONE SEEN NONE SEEN   Clue Cells Wet Prep HPF POC NONE SEEN NONE SEEN   WBC, Wet Prep HPF POC <10  <10   Sperm NONE SEEN     Comment: Performed at [facility], [address], [place], [place] [address]    IMAGING Bedside u/s done - reveals single IUP, posterior placenta, + FHR, + fetal movement.  MAU COURSE Reassurance given  ASSESSMENT 1. Supervision of other normal pregnancy, antepartum   2. Vaginal Pap smear, abnormal  3. Threatened abortion affecting intrauterine pregnancy   4. [redacted] weeks gestation of pregnancy     PLAN Discharge home Pelvic rest Modified bed rest Return and bleeding precautions F/u with primary provider Risk of miscarriage reviewed.   Follow-up Information     [facility] Follow up.   Specialty: Obstetrics and Gynecology Contact information: [address] [place] [address] [phone]               Allergies as of 12/23/2022   No Known Allergies      Medication List     TAKE these medications    PRENATAL COMPLETE PO Take by mouth.        Evaluation does not show pathology that would require ongoing emergent intervention or inpatient treatment. Patient is hemodynamically stable and mentating appropriately. Discussed findings and plan with patient, who agrees with care plan. All questions answered. Return precautions discussed and outpatient follow up recommendations given.  Reva Bores, MD 12/23/2022 7:45 PM

## 2022-12-24 ENCOUNTER — Telehealth: Payer: Self-pay | Admitting: *Deleted

## 2022-12-24 LAB — GC/CHLAMYDIA PROBE AMP (~~LOC~~) NOT AT ARMC
Chlamydia: NEGATIVE
Comment: NEGATIVE
Comment: NORMAL
Neisseria Gonorrhea: NEGATIVE

## 2022-12-24 NOTE — Telephone Encounter (Signed)
Patient called and left message that was hard to understand stating she had some back pain. I called her back and she reports yesterday she saw a spot of blood when she wiped and had some pain. We discussed that she went to the hospital MAU and was seen for that complaint.  She states doctor told her to follow up with her doctor. We discussed that she has her New OB appointment 01/03/23 in our office and that she should keep that appointment. I also advised if she has any other issues before then to call us back. She voices understanding.  Nancy Fetter

## 2023-01-03 ENCOUNTER — Encounter: Payer: Self-pay | Admitting: Obstetrics and Gynecology

## 2023-01-03 ENCOUNTER — Ambulatory Visit (INDEPENDENT_AMBULATORY_CARE_PROVIDER_SITE_OTHER): Payer: 59 | Admitting: Obstetrics and Gynecology

## 2023-01-03 ENCOUNTER — Other Ambulatory Visit: Payer: Self-pay

## 2023-01-03 VITALS — BP 122/66 | HR 106 | Wt 166.4 lb

## 2023-01-03 DIAGNOSIS — Z3481 Encounter for supervision of other normal pregnancy, first trimester: Secondary | ICD-10-CM

## 2023-01-03 DIAGNOSIS — Z3A12 12 weeks gestation of pregnancy: Secondary | ICD-10-CM

## 2023-01-03 DIAGNOSIS — Z3480 Encounter for supervision of other normal pregnancy, unspecified trimester: Secondary | ICD-10-CM

## 2023-01-03 NOTE — Patient Instructions (Signed)
We highly recommend childbirth education to help you plan for labor and begin practicing coping skills (which will be needed with or without pain meds).  [place] Childbirth Education Options: Sign up by visiting [email or web address]  Childbirth ~ Self-Paced eClass (English and Spanish) This online class offers you the freedom to complete a childbirth education series in the comfort of your own home at your own pace.  Childbirth Class (In-Person 4-Week Series  or on Saturdays, Virtual 4-Week Series ~ [place]) This interactive in-person class series will help you and your partner prepare for your birth experience. Topics include: Labor & Birth, Comfort Measures, Breathing Techniques, Massage, Medical Interventions, Pain Management Options, Cesarean Birth, Postpartum Care, and Newborn Care  Comfort Techniques for Labor ~ In-Person Class ([place]) This interactive class is designed for parents-to-be who want to learn & practice hands-on skills to help relieve some of the discomfort of labor and encourage their babies to rotate toward the best position for birth. Moms and their partners will be able to try a variety of labor positions with birth balls and rebozos as well as practice breathing, relaxation, and visualization techniques.  Natural Childbirth Class (In-Person 5-Week Series, In-Person on Saturdays or Virtual 5-Week Series ~ [place]) This class series is designed for expectant parents who want to learn and practice natural methods of coping with the process of labor and childbirth.  Cesarean Birth Self-Paced eClass (English and Spanish) This online course provides comprehensive information you can trust as you prepare for a possible cesarean birth. In this class, you'll learn how to make your birth and recovery comfortable and joyful through instructive video clips, animations, and activities.  Waterbirth ~ Virtual Class Interested in a waterbirth? In addition to a consultation  with your credentialed waterbirth provider, this free, informational online class will help you discover whether waterbirth is the right fit for you. Not all obstetrical practices offer waterbirth, so check with your healthcare provider.  Tour (Self-Paced Video) - [facility] Watch our 4 minute video tour of [facility] located in [place].   [place] Parenting Education Options:  Pregnancy 101 (Virtual) Congratulations on your pregnancy! This class is geared toward moms in their first trimester, but everyone is welcome. We are excited to guide you through all aspects of supporting a healthy pregnancy. You will learn what to expect at routine prenatal care appointments, common postpartum adjustments, basic infant safety, and breastfeeding.  Successful Partnering & Parenting ~ In-Person Workshop ([place]) This workshop inspires and equips partners of all economic levels, ages, and cultures to confidently care for their infants, support the birthing persons, and navigate their own transformations into new partners and parents. Learning activities are geared towards supporting partner, but moms are welcome to attend.  'Baby & Me' Parenting Group (Virtual on Wednesdays at 11am) Enjoy this time discussing newborn & infant parenting topics and family adjustment issues with other new parents in a relaxed environment. Each week brings a new speaker or baby-centered activity. This group offers support and connection to parents as they journey through the adjustments and struggles of that sometimes overwhelming first year after the birth of a child.  Baby Safety, CPR, & Choking Class ~ Virtual This life-saving information is meant to encourage parents as they learn important safety and prevention tips as well as infant CPR and relief of choking.  Breastfeeding Class (In-Person in [place] or Virtual) Families learn what to expect in the  first days and weeks of breastfeeding your   newborn. IF YOU ARE AN EMPLOYEE TAKING THIS CLASS FOR CREDIT, DO NOT register yourself. Please e-mail [email or web address].   Breastfeeding Self-Paced eClass (English & Spanish) Families learn what to expect in the first days and weeks of breastfeeding your newborn.  Caring for Baby ~ In-Person, Virtual or Self-Paced Class This in-person class is for both expectant and adoptive parents who want to learn and practice the most up-to-date newborn care for their babies. Focus is on birth through the first six weeks of life.  CPR & Choking Relief for Infants & Children ~ In-Person Class ([place]) This in-person course is designed for any parent, expectant parent, or adult who cares for infants or children. Participants learn and demonstrate cardiopulmonary resuscitation and choking relief procedures for both infants and children.  Grandparent Love ~ In-Person Class Grandparents will learn the most updated infant care and safety recommendations. They will discover ways to support their own children during the transition into the parenting role and receive tips on communicating with the new parents.  [place] Parenting Support Group Options:  Bereavement Grief Support Group (Pregnancy/Infant Loss) - Virtual This is an ongoing experience that meets once a month and is designed to help you honor the past, assist you in discovering tools to strengthen you today, and aid you in developing hope for the future.  Breastfeeding & Pumping Support Group (In-Person on Thursdays at 12pm or Virtual on Tuesdays at 5pm) Join us in-person each Thursday starting June 1st, 2023 at 12pm! This support group is free for all families looking for breastfeeding and/or pumping support.   Community-Based Childbirth Education Options:  [place] Classes:  Childbirth education classes can help you get ready for a positive parenting experience. You  can also meet other expectant parents and get free stuff for your baby. Each class runs for five weeks on the same night and costs $45 for the mother-to-be and her support person. Medicaid covers the cost if you are eligible. Call [phone] to register.  [facility] offers a variety of programs for the [place] community and is another great way to get connected. Please go to [email or web address] for more information.  Childbirth With A Twist! Be informed of your options, get educated on birth, understand what your body is doing, learn how to cope, and have a lot of fun and laughs all while doing it either from the comfort of your couch OR in our cozy office and classroom space near the [place] airport. If you are taking a virtual class, then class is taught LIVE, so you can ask questions and receive answers in real-time from an experienced doula and childbirth educator.  This virtual childbirth education class will meet for five instruction times online.  Although we are based in [place], [place], this virtual class is open to anyone in the world. Please visit: [email or web address] for more information.  Books We Love: The Doula Guide to Childbirth by Ananda Lowe and Rachel Zimmerman The First-Time Parent's Childbirth Handbook by Dr. Stephanie Mitchell, CNM The Birth Partner by Penny Simkin  

## 2023-01-03 NOTE — Progress Notes (Signed)
INITIAL PRENATAL VISIT  Subjective:   Dorothy Wells is being seen today for her first obstetrical visit.  She is at 108w5d gestation by ultrasound Her obstetrical history is significant for . Relationship with FOB: spouse, living together. Patient does intend to breast feed. Pregnancy history fully reviewed.  Patient reports no complaints  Indications for ASA therapy (per uptodate) One of the following: Previous pregnancy with preeclampsia, especially early onset and with an adverse outcome No Multifetal gestation No Chronic hypertension No Type 1 or 2 diabetes mellitus No Chronic kidney disease No Autoimmune disease (antiphospholipid syndrome, systemic lupus erythematosus) No  Two or more of the following: Nulliparity No Obesity (body mass index >30 kg/m2) No Family history of preeclampsia in mother or sister No Age >=35 years No Sociodemographic characteristics (African American race, low socioeconomic level) No Personal risk factors (eg, previous pregnancy with low birth weight or small for gestational age infant, previous adverse pregnancy outcome [eg, stillbirth], interval >10 years between pregnancies) No  Indications for early GDM screening  First-degree relative with diabetes No BMI >30kg/m2 No Age > 25 Yes Previous birth of an infant weighing >=4000 g No Gestational diabetes mellitus in a previous pregnancy No Glycated hemoglobin >=5.7 percent (39 mmol/mol), impaired glucose tolerance, or impaired fasting glucose on previous testing No High-risk race/ethnicity (eg, African American, Latino, Native American, [place] American, Pacific Islander) Yes  Early screening tests: FBS, A1C, Random CBG, glucose challenge   Objective:    Obstetric History OB History  Gravida Para Term Preterm AB Living  4 2 2  0 1 2  SAB IAB Ectopic Multiple Live Births  1 0 0 0 2    # Outcome Date GA Lbr Len/2nd Weight Sex Type Anes PTL Lv  4 Current           3 Term 05/28/21  [redacted]w[redacted]d   M Vag-Spont None  LIV  2 Term 06/05/17 [redacted]w[redacted]d   M Vag-Spont EPI  LIV  1 SAB 2018            Past Medical History:  Diagnosis Date   Vaginal Pap smear, abnormal     Past Surgical History:  Procedure Laterality Date   COLPOSCOPY     DILATION AND CURETTAGE OF UTERUS  2018   miscarriage   LEEP      Current Outpatient Medications on File Prior to Visit  Medication Sig Dispense Refill   Prenat MV-Min w/Fe-Folate-DHA (PRENATAL COMPLETE PO) Take by mouth.     No current facility-administered medications on file prior to visit.    No Known Allergies  Social History:  reports that she has never smoked. She has never used smokeless tobacco. She reports that she does not drink alcohol and does not use drugs.  Family History  Problem Relation Age of Onset   Arthritis Mother    Hypertension Maternal Grandfather    Hypertension Paternal Grandfather     The following portions of the patient's history were reviewed and updated as appropriate: allergies, current medications, past family history, past medical history, past social history, past surgical history and problem list.  Review of Systems Review of Systems  All other systems reviewed and are negative.     Physical Exam:  BP 122/66   Pulse (!) 106   Wt 166 lb 6.4 oz (75.5 kg)   LMP 09/26/2022 (Exact Date)   BMI 25.30 kg/m  CONSTITUTIONAL: Well-developed, well-nourished female in no acute distress.  HENT:  Normocephalic, atraumatic.   EYES: Conjunctivae normal. No  scleral icterus.  NECK: Normal range of motion SKIN: Skin is warm and dry.  MUSCULOSKELETAL: Normal range of motion NEUROLOGIC: Alert and oriented PSYCHIATRIC: Normal mood and affect. Normal behavior. Normal judgment and thought content. CARDIOVASCULAR: Normal heart rate noted, regular rhythm RESPIRATORY: normal effort ABDOMEN: Soft PELVIC: deferred  Fetal Heart Rate (bpm): 145   Movement: Absent       Assessment:    Pregnancy: [id number]  1.  Supervision of other normal pregnancy, antepartum BP and FHR normal Declined Flu today  Second baby delivered in the car at the hospital   Initial labs drawn. Prenatal vitamins. Problem list reviewed and updated. Reviewed in detail the nature of the practice with collaborative care between  Genetic screening discussed: NIPS/First trimester screen/Quad/AFP ordered. Role of ultrasound in pregnancy discussed; Anatomy US: ordered.  - Culture, OB Urine - CBC/D/Plt+RPR+Rh+ABO+RubIgG... - Hemoglobin A1c - PANORAMA PRENATAL TEST - [place] Basic Panel   Follow up in 4 weeks. Discussed clinic routines, schedule of care and testing, genetic screening options, involvement of students and residents under the direct supervision of APPs and doctors and presence of female providers. Pt verbalized understanding.  Return in 4 weeks for routine prenatal   Future Appointments  Date Time Provider Department Center  01/31/2023  9:15 AM Sue Lush, FNP [facility]  02/21/2023  8:15 AM [facility] NURSE [facility]  02/21/2023  8:30 AM [facility] US3 [facility]    Sue Lush, FNP

## 2023-01-04 LAB — CBC/D/PLT+RPR+RH+ABO+RUBIGG...
Antibody Screen: NEGATIVE
Basophils Absolute: 0 10*3/uL (ref 0.0–0.2)
Basos: 0 %
EOS (ABSOLUTE): 0.1 10*3/uL (ref 0.0–0.4)
Eos: 1 %
HCV Ab: NONREACTIVE
HIV Screen 4th Generation wRfx: NONREACTIVE
Hematocrit: 35.3 % (ref 34.0–46.6)
Hemoglobin: 11.7 g/dL (ref 11.1–15.9)
Hepatitis B Surface Ag: NEGATIVE
Immature Grans (Abs): 0 10*3/uL (ref 0.0–0.1)
Immature Granulocytes: 1 %
Lymphocytes Absolute: 1.2 10*3/uL (ref 0.7–3.1)
Lymphs: 14 %
MCH: 30.3 pg (ref 26.6–33.0)
MCHC: 33.1 g/dL (ref 31.5–35.7)
MCV: 92 fL (ref 79–97)
Monocytes Absolute: 0.4 10*3/uL (ref 0.1–0.9)
Monocytes: 5 %
Neutrophils Absolute: 6.5 10*3/uL (ref 1.4–7.0)
Neutrophils: 79 %
Platelets: 326 10*3/uL (ref 150–450)
RBC: 3.86 x10E6/uL (ref 3.77–5.28)
RDW: 13.4 % (ref 11.7–15.4)
RPR Ser Ql: NONREACTIVE
Rh Factor: POSITIVE
Rubella Antibodies, IGG: 2.17 {index} (ref >0.99)
WBC: 8.2 10*3/uL (ref 3.4–10.8)

## 2023-01-04 LAB — HEMOGLOBIN A1C
Est. average glucose Bld gHb Est-mCnc: 103 mg/dL
Hgb A1c MFr Bld: 5.2 % (ref 4.8–5.6)

## 2023-01-04 LAB — HCV INTERPRETATION

## 2023-01-05 LAB — URINE CULTURE, OB REFLEX

## 2023-01-05 LAB — CULTURE, OB URINE

## 2023-01-07 DIAGNOSIS — Z3689 Encounter for other specified antenatal screening: Secondary | ICD-10-CM | POA: Diagnosis not present

## 2023-01-09 LAB — PANORAMA PRENATAL TEST FULL PANEL:PANORAMA TEST PLUS 5 ADDITIONAL MICRODELETIONS: FETAL FRACTION: 14.5

## 2023-01-10 LAB — HORIZON CUSTOM: REPORT SUMMARY: NEGATIVE

## 2023-01-31 ENCOUNTER — Encounter: Payer: 59 | Admitting: Obstetrics and Gynecology

## 2023-02-01 DIAGNOSIS — Z361 Encounter for antenatal screening for raised alphafetoprotein level: Secondary | ICD-10-CM | POA: Diagnosis not present

## 2023-02-01 DIAGNOSIS — Z3689 Encounter for other specified antenatal screening: Secondary | ICD-10-CM | POA: Diagnosis not present

## 2023-02-01 DIAGNOSIS — Z3482 Encounter for supervision of other normal pregnancy, second trimester: Secondary | ICD-10-CM | POA: Diagnosis not present

## 2023-02-09 DIAGNOSIS — Z9889 Other specified postprocedural states: Secondary | ICD-10-CM | POA: Insufficient documentation

## 2023-02-09 DIAGNOSIS — O3440 Maternal care for other abnormalities of cervix, unspecified trimester: Secondary | ICD-10-CM | POA: Insufficient documentation

## 2023-02-21 ENCOUNTER — Ambulatory Visit: Payer: 59

## 2023-02-21 DIAGNOSIS — Z9889 Other specified postprocedural states: Secondary | ICD-10-CM

## 2023-03-07 DIAGNOSIS — Z3A21 21 weeks gestation of pregnancy: Secondary | ICD-10-CM | POA: Diagnosis not present

## 2023-03-07 DIAGNOSIS — Z9889 Other specified postprocedural states: Secondary | ICD-10-CM | POA: Diagnosis not present

## 2023-03-30 NOTE — L&D Delivery Note (Signed)
 Delivery Note At  0657 a viable and healthy female was delivered precipitously over intact perineum  via svd (Presentation: cephalic; LOA ).  Delivery by Sundra Aland, MD due to precipitous nature of delivery. I arrived just after delivery and placenta was then delivered. APGAR: , ; weight  .   Placenta status: delicered ,spontaneously, intact .  Cord:  3vv with the following complications: none  Anesthesia:  none Episiotomy:  none Lacerations:  none Suture Repair:  n/a Est. Blood Loss (mL):   Mom to postpartum.  Baby to Couplet care / Skin to Skin.  Vick Frees 07/04/2023, 1:01 PM

## 2023-04-15 DIAGNOSIS — Z3482 Encounter for supervision of other normal pregnancy, second trimester: Secondary | ICD-10-CM | POA: Diagnosis not present

## 2023-04-15 DIAGNOSIS — Z3483 Encounter for supervision of other normal pregnancy, third trimester: Secondary | ICD-10-CM | POA: Diagnosis not present

## 2023-05-02 DIAGNOSIS — Z3A29 29 weeks gestation of pregnancy: Secondary | ICD-10-CM | POA: Diagnosis not present

## 2023-05-02 DIAGNOSIS — Z3493 Encounter for supervision of normal pregnancy, unspecified, third trimester: Secondary | ICD-10-CM | POA: Diagnosis not present

## 2023-06-13 DIAGNOSIS — O2603 Excessive weight gain in pregnancy, third trimester: Secondary | ICD-10-CM | POA: Diagnosis not present

## 2023-06-13 DIAGNOSIS — Z3A36 36 weeks gestation of pregnancy: Secondary | ICD-10-CM | POA: Diagnosis not present

## 2023-06-27 DIAGNOSIS — Z3685 Encounter for antenatal screening for Streptococcus B: Secondary | ICD-10-CM | POA: Diagnosis not present

## 2023-06-27 LAB — OB RESULTS CONSOLE GBS: GBS: NEGATIVE

## 2023-07-03 ENCOUNTER — Encounter (HOSPITAL_COMMUNITY): Payer: Self-pay | Admitting: Obstetrics and Gynecology

## 2023-07-03 ENCOUNTER — Other Ambulatory Visit: Payer: Self-pay

## 2023-07-03 ENCOUNTER — Inpatient Hospital Stay (HOSPITAL_COMMUNITY)
Admission: AD | Admit: 2023-07-03 | Discharge: 2023-07-05 | DRG: 807 | Disposition: A | Discharge status: Home / Self Care | Attending: Obstetrics and Gynecology | Admitting: Obstetrics and Gynecology

## 2023-07-03 DIAGNOSIS — Z3A38 38 weeks gestation of pregnancy: Secondary | ICD-10-CM | POA: Diagnosis not present

## 2023-07-03 DIAGNOSIS — O26893 Other specified pregnancy related conditions, third trimester: Secondary | ICD-10-CM | POA: Diagnosis present

## 2023-07-03 LAB — TYPE AND SCREEN
ABO/RH(D): O POS
Antibody Screen: NEGATIVE

## 2023-07-03 LAB — CBC
HCT: 31.6 % — ABNORMAL LOW (ref 36.0–46.0)
Hemoglobin: 10 g/dL — ABNORMAL LOW (ref 12.0–15.0)
MCH: 25.8 pg — ABNORMAL LOW (ref 26.0–34.0)
MCHC: 31.6 g/dL (ref 30.0–36.0)
MCV: 81.4 fL (ref 80.0–100.0)
Platelets: 307 10*3/uL (ref 150–400)
RBC: 3.88 MIL/uL (ref 3.87–5.11)
RDW: 16.4 % — ABNORMAL HIGH (ref 11.5–15.5)
WBC: 11.3 10*3/uL — ABNORMAL HIGH (ref 4.0–10.5)
nRBC: 0 % (ref 0.0–0.2)

## 2023-07-03 LAB — RPR: RPR Ser Ql: NONREACTIVE

## 2023-07-03 MED ORDER — PRENATAL MULTIVITAMIN CH
1.0000 | ORAL_TABLET | Freq: Every day | ORAL | Status: DC
  Administered 2023-07-03 – 2023-07-05 (×3): 1 via ORAL
  Filled 2023-07-03 (×4): qty 1

## 2023-07-03 MED ORDER — TETANUS-DIPHTH-ACELL PERTUSSIS 5-2.5-18.5 LF-MCG/0.5 IM SUSY: 0.5000 mL | PREFILLED_SYRINGE | Freq: Once | INTRAMUSCULAR | Status: DC

## 2023-07-03 MED ORDER — WITCH HAZEL-GLYCERIN EX PADS: 1.0000 | MEDICATED_PAD | CUTANEOUS | Status: DC | PRN

## 2023-07-03 MED ORDER — SENNOSIDES-DOCUSATE SODIUM 8.6-50 MG PO TABS
2.0000 | ORAL_TABLET | Freq: Every day | ORAL | Status: DC
  Administered 2023-07-03 – 2023-07-05 (×2): 2 via ORAL
  Filled 2023-07-03 (×2): qty 2

## 2023-07-03 MED ORDER — IBUPROFEN 800 MG PO TABS
800.0000 mg | ORAL_TABLET | Freq: Once | ORAL | Status: AC
  Administered 2023-07-03: 800 mg via ORAL
  Filled 2023-07-03: qty 1

## 2023-07-03 MED ORDER — ONDANSETRON HCL 4 MG/2ML IJ SOLN: 4.0000 mg | INTRAMUSCULAR | Status: DC | PRN

## 2023-07-03 MED ORDER — IBUPROFEN 600 MG PO TABS
600.0000 mg | ORAL_TABLET | Freq: Four times a day (QID) | ORAL | Status: DC
  Administered 2023-07-03 – 2023-07-05 (×7): 600 mg via ORAL
  Filled 2023-07-03 (×8): qty 1

## 2023-07-03 MED ORDER — ONDANSETRON HCL 4 MG PO TABS: 4.0000 mg | ORAL_TABLET | ORAL | Status: DC | PRN

## 2023-07-03 MED ORDER — SENNOSIDES-DOCUSATE SODIUM 8.6-50 MG PO TABS: 2.0000 | ORAL_TABLET | Freq: Every day | ORAL | Status: DC

## 2023-07-03 MED ORDER — BENZOCAINE-MENTHOL 20-0.5 % EX AERO: 1.0000 | INHALATION_SPRAY | CUTANEOUS | Status: DC | PRN

## 2023-07-03 MED ORDER — ACETAMINOPHEN 325 MG PO TABS: 650.0000 mg | ORAL_TABLET | ORAL | Status: DC | PRN

## 2023-07-03 MED ORDER — IBUPROFEN 600 MG PO TABS: 600.0000 mg | ORAL_TABLET | Freq: Four times a day (QID) | ORAL | Status: DC

## 2023-07-03 MED ORDER — DIPHENHYDRAMINE HCL 25 MG PO CAPS: 25.0000 mg | ORAL_CAPSULE | Freq: Four times a day (QID) | ORAL | Status: DC | PRN

## 2023-07-03 MED ORDER — COCONUT OIL OIL: 1.0000 | TOPICAL_OIL | Status: DC | PRN

## 2023-07-03 MED ORDER — SIMETHICONE 80 MG PO CHEW: 80.0000 mg | CHEWABLE_TABLET | ORAL | Status: DC | PRN

## 2023-07-03 MED ORDER — OXYTOCIN 10 UNIT/ML IJ SOLN
INTRAMUSCULAR | Status: AC
  Administered 2023-07-03: 10 [IU] via INTRAMUSCULAR
  Filled 2023-07-03: qty 1

## 2023-07-03 MED ORDER — DIBUCAINE (PERIANAL) 1 % EX OINT: 1.0000 | TOPICAL_OINTMENT | CUTANEOUS | Status: DC | PRN

## 2023-07-03 NOTE — H&P (Signed)
 Dorothy Wells is a 33 y.o. [phone] at [redacted]w[redacted]d gestation presents for complaint of Contractions. Patient arrived on unit completely dilated. SROM then precipitous delivery.  Antepartum course:  uncomplicated, h/o rapid delivery   [facility] at [facility] since 13 wks.  See complete pre-natal records  History OB History     Gravida  4   Para  2   Term  2   Preterm  0   AB  1   Living  2      SAB  1   IAB  0   Ectopic  0   Multiple  0   Live Births  2          Past Medical History:  Diagnosis Date   Vaginal Pap smear, abnormal    Past Surgical History:  Procedure Laterality Date   COLPOSCOPY     DILATION AND CURETTAGE OF UTERUS  2018   miscarriage   LEEP     Family History: family history includes Arthritis in her mother; Hypertension in her maternal grandfather and paternal grandfather. Social History:  reports that she has never smoked. She has never used smokeless tobacco. She reports that she does not drink alcohol and does not use drugs.  ROS: See above otherwise negative  Prenatal labs:  ABO, Rh: O/Positive/-- (10/07 1049) Antibody: Negative (10/07 1049) Rubella: 2.17 (10/07 1049) RPR: Non Reactive (10/07 1049)  HBsAg: Negative (10/07 1049)  HIV:Non Reactive (10/07 1049)  GBS: Negative/-- (03/31 0000)  1 hr Glucola: Normal Genetic screening: Normal; hor 14 neg Anatomy US: Normal  Physical Exam:     Blood pressure 134/76, pulse 90, resp. rate 16, last menstrual period 09/26/2022. A&O x 3 HEENT: Normal Lungs: CTAB CV: RRR Abdominal: Soft and Non-tender Lower Extremities: Non-edematous, Non-tender  Pelvic Exam:      See delivery note  Labs:  pending  Prenatal Transfer Tool  Maternal Diabetes: No Genetic Screening: Normal Maternal Ultrasounds/Referrals: Normal Fetal Ultrasounds or other Referrals:  None Maternal Substance Abuse:  No Significant Maternal Medications:  None Significant Maternal Lab Results: Group B  Strep negative Number of Prenatal Visits:greater than 3 verified prenatal visits Maternal Vaccinations: declined tdap Other Comments:  None    Assessment/Plan:  33 y.o. [id number] at [redacted]w[redacted]d gestation   Active labor with very precipitous delivery - admitted; see delivery note for delivery details Gbs neg RH pos RI   Vick Frees 07/03/2023, 7:26 AM

## 2023-07-03 NOTE — MAU Note (Signed)
..  Dorothy Wells is a 33 y.o. at [redacted]w[redacted]d here in MAU reporting: contractions that are now every few minutes. Denies vaginal bleeding or leaking of fluid.  Pt brought directly to room d/t intensity of contractions.  SVE by Shelly Coss, CNM 10 0 station LD called for room assignment. Patient transported to LD accompanied by provider.

## 2023-07-03 NOTE — Lactation Note (Signed)
 This note was copied from a baby's chart. Lactation Consultation Note  Patient Name: Dorothy Wells [id number] Date: 07/03/2023 Age:31 hours  Reason for consult: Initial assessment;Early term 37-38.6wks  P3, precip. delivery, [redacted]w[redacted]d  Initial LC visit to see P3 mother and her baby Dorothy, Casimiro Needle. Mother states baby has been breastfeeding very well. She denies any questions or concerns. She would like for LC to check with her because "every baby is different".Baby is currently sleeping skin to skin with mother after his feeding.   Mom made aware of O/P services, breastfeeding support groups, and our phone # for post-discharge questions.    Maternal Data Has patient been taught Hand Expression?: No Does the patient have breastfeeding experience prior to this delivery?: Yes How long did the patient breastfeed?: 3.5 years with 1st, breastfed during her 2nd pregnancy, breast fed 2nd child for 1.5 years  Feeding Mother's Current Feeding Choice: Breast Milk   Interventions Interventions: Breast feeding basics reviewed;Education;LC Services brochure;CDC milk storage guidelines;CDC Guidelines for Breast Pump Cleaning  Discharge Pump: Personal  Consult Status Consult Status: Follow-up Date: 07/04/23 Follow-up type: In-patient    Christella Hartigan M 07/03/2023, 5:04 PM

## 2023-07-04 LAB — CBC
HCT: 31.8 % — ABNORMAL LOW (ref 36.0–46.0)
Hemoglobin: 10.2 g/dL — ABNORMAL LOW (ref 12.0–15.0)
MCH: 26.2 pg (ref 26.0–34.0)
MCHC: 32.1 g/dL (ref 30.0–36.0)
MCV: 81.7 fL (ref 80.0–100.0)
Platelets: 296 10*3/uL (ref 150–400)
RBC: 3.89 MIL/uL (ref 3.87–5.11)
RDW: 16.7 % — ABNORMAL HIGH (ref 11.5–15.5)
WBC: 11.7 10*3/uL — ABNORMAL HIGH (ref 4.0–10.5)
nRBC: 0 % (ref 0.0–0.2)

## 2023-07-04 NOTE — Lactation Note (Signed)
 This note was copied from a baby's chart. Lactation Consultation Note  Patient Name: Dorothy Wells [id number] Date: 07/04/2023 Age:31 hours Reason for consult: Follow-up assessment;Early term 37-38.6wks  P3, 38 weeks. Couplet working together with arrival- experienced breast feeding mom. Made a few positioning changes. Discussed  Day 2 more awake/ feeding cues/longer feeds- offer both breasts, and cluster feeding overnights brings milk in. Highlighted breast stimulation is tied directly to milk production. Discussed hands on breast and baby, keeping baby awake @ breast. Starting with hand expression & breast compression to get baby working @ breast, and gentle stimulation to keep baby working @ breast. Encouraged EBM for nipple care post feed. LC services and milk storage shared. Encouraged mom to call for assist anytime desired. Hand pump provided, highlighted as best tool to soften engorged breasts.  Maternal Data Has patient been taught Hand Expression?: Yes Does the patient have breastfeeding experience prior to this delivery?: Yes How long did the patient breastfeed?: 3.5 years with 1st, breastfed during her 2nd pregnancy, breast fed 2nd child for 1.5 years  Feeding Mother's Current Feeding Choice: Breast Milk  LATCH Score Latch: Grasps breast easily, tongue down, lips flanged, rhythmical sucking.  Audible Swallowing: Spontaneous and intermittent  Type of Nipple: Everted at rest and after stimulation  Comfort (Breast/Nipple): Soft / non-tender  Hold (Positioning): Assistance needed to correctly position infant at breast and maintain latch.  LATCH Score: 9   Lactation Tools Discussed/Used Tools: Pump Breast pump type: Manual Pump Education: Milk Storage  Interventions Interventions: Breast feeding basics reviewed;Adjust position;Hand pump;Education;LC Services brochure;CDC milk storage guidelines  Discharge Pump: Manual;Hands Free;Personal  Consult  Status Consult Status: Follow-up Date: 07/05/23 Follow-up type: In-patient    [facility] 07/04/2023, 5:02 PM

## 2023-07-04 NOTE — Progress Notes (Signed)
 Post Partum Day One Subjective: no complaints. Reports pain well controlled. Ambulating without dizziness. VB is getting lighter. Spontaneously voiding without difficulties. Tolerating regular diet without N/V. No HA, CP, or SOB.  Baby boy is doing well at bedside. BF going well. Declines circumcision.  Objective: Patient Vitals for the past 24 hrs:  BP Temp Temp src Pulse Resp SpO2  07/04/23 0633 131/88 98.1 F (36.7 C) Oral 90 -- 100 %  07/03/23 2344 123/81 98.3 F (36.8 C) Oral 80 -- 100 %  07/03/23 2003 128/84 98.4 F (36.9 C) Oral 80 18 99 %  07/03/23 1510 135/78 98.4 F (36.9 C) Oral 88 20 --    Physical Exam:  General: alert and no distress Lochia: appropriate Uterine Fundus: firm DVT Evaluation: No evidence of DVT seen on physical exam.  Recent Labs    07/03/23 0745 07/04/23 0322  WBC 11.3* 11.7*  HGB 10.0* 10.2*  HCT 31.6* 31.8*  PLT 307 296    No results for input(s): "NA", "K", "CL", "CO2CT", "BUN", "CREATININE", "GLUCOSE", "BILITOT", "ALT", "AST", "ALKPHOS", "PROT", "ALBUMIN" in the last 72 hours.  No results for input(s): "CALCIUM", "MG", "PHOS" in the last 72 hours.  No results for input(s): "PROTIME", "APTT", "INR" in the last 72 hours.  No results for input(s): "PROTIME", "APTT", "INR", "FIBRINOGEN" in the last 72 hours. Assessment/Plan: Plan for discharge tomorrow  Dorothy Wells 33 y.o. [id number] PPD#1 sp NSVD @ 38.4 precipitous delivery 1. PPC: cont routine PP pain regimen, regular diet, encourage ambulation 2. RH POS, RI 3. LC support PRN  Remain inpatient since baby is not cleared for discharge today. Otherwise meeting all routine postpartum milestones and anticipate discharge home tomorrow on PPD2   LOS: 1 day   Akim Watkinson A Osker Ayoub 07/04/2023, 11:16 AM

## 2023-07-05 ENCOUNTER — Encounter (HOSPITAL_COMMUNITY): Payer: Self-pay | Admitting: Obstetrics and Gynecology

## 2023-07-05 MED ORDER — IBUPROFEN 600 MG PO TABS: 600.0000 mg | ORAL_TABLET | Freq: Four times a day (QID) | ORAL | 0 refills | Status: AC

## 2023-07-05 MED ORDER — ACETAMINOPHEN 325 MG PO TABS: 650.0000 mg | ORAL_TABLET | ORAL | Status: AC | PRN

## 2023-07-05 NOTE — Discharge Summary (Signed)
 Postpartum Discharge Summary  Date of Service updated***     Patient Name: Dorothy Wells DOB: 07-17-1990 MRN: [id number]  Date of admission: 07/03/2023 Delivery date:07/03/2023 Delivering provider: Rhoderick Moody E Date of discharge: 07/05/2023  Admitting diagnosis: SVD (spontaneous vaginal delivery) [O80] Intrauterine pregnancy: [redacted]w[redacted]d     Secondary diagnosis:  Principal Problem:   SVD (spontaneous vaginal delivery)  Additional problems: ***    Discharge diagnosis: {DX.:23714}                                              Post partum procedures:{Postpartum procedures:23558} Augmentation: {Augmentation:20782} Complications: {OB Labor/Delivery Complications:20784}  Hospital course: {Courses:23701}  Magnesium Sulfate received: {Mag received:30440022} BMZ received: {BMZ received:30440023} Rhophylac:{Rhophylac received:30440032} QIO:{NGE:95284132} T-DaP:{Tdap:23962} Flu: {GMW:10272} RSV Vaccine received: {RSV:31013} Transfusion:{Transfusion received:30440034} Immunizations administered: Immunization History  Administered Date(s) Administered   Tdap 07/10/2013    Physical exam  Vitals:   07/04/23 1317 07/04/23 2039 07/04/23 2130 07/05/23 0632  BP: 129/80 (!) 137/90 129/84 123/80  Pulse: 80 93 88 97  Resp:  18  18  Temp: 98 F (36.7 C) 99 F (37.2 C)  98.6 F (37 C)  TempSrc: Oral Oral    SpO2:  99%  100%   General: {Exam; general:21111117} Lochia: {Desc; appropriate/inappropriate:30686::"appropriate"} Uterine Fundus: {Desc; firm/soft:30687} Incision: {Exam; incision:21111123} DVT Evaluation: {Exam; dvt:2111122} Labs: Lab Results  Component Value Date   WBC 11.7 (H) 07/04/2023   HGB 10.2 (L) 07/04/2023   HCT 31.8 (L) 07/04/2023   MCV 81.7 07/04/2023   PLT 296 07/04/2023      Latest Ref Rng & Units 12/21/2021    9:57 AM  CMP  Glucose 70 - 99 mg/dL 76   BUN 6 - 23 mg/dL 15   Creatinine 5.36 - 1.20 mg/dL 6.44   Sodium 034 - 742 mEq/L 138    Potassium 3.5 - 5.1 mEq/L 4.3   Chloride 96 - 112 mEq/L 105   CO2 19 - 32 mEq/L 25   Calcium 8.4 - 10.5 mg/dL 9.2   Total Protein 6.0 - 8.3 g/dL 7.4   Total Bilirubin 0.2 - 1.2 mg/dL 0.3   Alkaline Phos 39 - 117 U/L 137   AST 0 - 37 U/L 9   ALT 0 - 35 U/L 8    Edinburgh Score:    07/03/2023    8:53 AM  Edinburgh Postnatal Depression Scale Screening Tool  I have been able to laugh and see the funny side of things. 0  I have looked forward with enjoyment to things. 0  I have blamed myself unnecessarily when things went wrong. 0  I have been anxious or worried for no good reason. 0  I have felt scared or panicky for no good reason. 0  Things have been getting on top of me. 0  I have been so unhappy that I have had difficulty sleeping. 0  I have felt sad or miserable. 0  I have been so unhappy that I have been crying. 0  The thought of harming myself has occurred to me. 0  Edinburgh Postnatal Depression Scale Total 0      After visit meds:  Allergies as of 07/05/2023   No Known Allergies      Medication List     TAKE these medications    acetaminophen 325 MG tablet Commonly known as: Tylenol Take 2 tablets (650  mg total) by mouth every 4 (four) hours as needed (for pain scale < 4).   ibuprofen 600 MG tablet Commonly known as: ADVIL Take 1 tablet (600 mg total) by mouth every 6 (six) hours.   PRENATAL COMPLETE PO Take by mouth.         Discharge home in stable condition Infant Feeding: {Baby feeding:23562} Infant Disposition:{CHL IP OB HOME WITH ZOXWRU:04540} Discharge instruction: per After Visit Summary and Postpartum booklet. Activity: Advance as tolerated. Pelvic rest for 6 weeks.  Diet: {OB diet:21111121} Anticipated Birth Control: {Birth Control:23956} Postpartum Appointment:{Outpatient follow up:23559} Additional Postpartum F/U: {PP Procedure:23957} Future Appointments:No future appointments. Follow up Visit:  Follow-up Information     Amado Nash  Candace Gallus, MD Follow up in 6 week(s).   Specialty: Obstetrics and Gynecology Contact information: [address] [place] [address] [phone]                     07/05/2023 Robley Fries, MD

## 2023-07-05 NOTE — Lactation Note (Signed)
 This note was copied from a baby's chart. Lactation Consultation Note  Patient Name: Boy Zamiya Dillard [id number] Date: 07/05/2023 Age:31 hours, P3  Reason for consult: Follow-up assessment;Early term 37-38.6wks;Infant weight loss;Breastfeeding assistance (6 % weight loss) As LC entered the room mom was latching the baby on the left breast and trying to eat her breakfast. Mom mentioned the baby had fed on the other breast for 50 mins.  LC offered to assist and recommended switching the position to football so mom could eat at the same time.  Baby latched easily and ate 10 mins. ( Swallows noted and per mom comfortable). Latch score 8  LC reviewed breast feeding D/C teaching and the [facility] resources.  See below for more details.  Maternal Data Has patient been taught Hand Expression?: Yes Does the patient have breastfeeding experience prior to this delivery?: Yes  Feeding Mother's Current Feeding Choice: Breast Milk  LATCH Score Latch: Grasps breast easily, tongue down, lips flanged, rhythmical sucking.  Audible Swallowing: A few with stimulation  Type of Nipple: Everted at rest and after stimulation  Comfort (Breast/Nipple): Soft / non-tender  Hold (Positioning): Assistance needed to correctly position infant at breast and maintain latch.  LATCH Score: 8   Lactation Tools Discussed/Used Tools: Pump;Flanges Flange Size: 24;27;21;Other (comment) (LC added #21 F and #27 F for when the miilk comes in) Breast pump type: Manual Pump Education: Milk Storage;Setup, frequency, and cleaning;Other (comment) (per mom has already been instructed on the hand pump and the #24 F is comfortable)  Interventions Interventions: Breast feeding basics reviewed;Assisted with latch;Skin to skin;Breast massage;Hand express;Breast compression;Adjust position;Support pillows;Position options;Hand pump;Education;LC Services brochure;CDC milk storage guidelines;CDC Guidelines for Breast Pump  Cleaning  Discharge Discharge Education: Engorgement and breast care;Warning signs for feeding baby Pump: Personal;Hands Free;Manual  Consult Status Consult Status: Complete Date: 07/05/23    Kathrin Greathouse 07/05/2023, 8:38 AM

## 2023-07-05 NOTE — Progress Notes (Signed)
 Post Partum Day two  SVD  Subjective: no complaints. Reports pain well controlled. Ambulating without dizziness. VB is getting lighter. Spontaneously voiding without difficulties. Tolerating regular diet without N/V. No HA, CP, or SOB.  Baby boy is doing well at bedside. BF going well. Declines circumcision.  Objective: Patient Vitals for the past 24 hrs:  BP Temp Temp src Pulse Resp SpO2  07/05/23 0632 123/80 98.6 F (37 C) -- 97 18 100 %  07/04/23 2130 129/84 -- -- 88 -- --  07/04/23 2039 (!) 137/90 99 F (37.2 C) Oral 93 18 99 %  07/04/23 1317 129/80 98 F (36.7 C) Oral 80 -- --    Physical Exam:  General: alert and no distress Lochia: appropriate Uterine Fundus: firm DVT Evaluation: No evidence of DVT seen on physical exam.  Recent Labs    07/03/23 0745 07/04/23 0322  WBC 11.3* 11.7*  HGB 10.0* 10.2*  HCT 31.6* 31.8*  PLT 307 296   Assessment/Plan:  Dorothy Wells 33 y.o. [id number] PPD#1 sp NSVD @ 38.4 precipitous delivery 1. PPC: cont routine PP pain regimen, regular diet, encourage ambulation 2. RH POS, RI 3. LC support PRN  DIscharge home, f/up office 6 wks  Warning ss dw pt and pp care dw pt    LOS: 2 days   Robley Fries 07/05/2023, 12:01 PM

## 2023-07-13 ENCOUNTER — Telehealth (HOSPITAL_COMMUNITY): Payer: Self-pay | Admitting: *Deleted

## 2023-07-13 ENCOUNTER — Inpatient Hospital Stay (HOSPITAL_COMMUNITY): Admission: AD | Admit: 2023-07-13 | Source: Home / Self Care | Admitting: Obstetrics

## 2023-07-13 NOTE — Telephone Encounter (Signed)
 07/13/2023  Name: Dorothy Wells MRN: [id number] DOB: 16-Feb-1991  Reason for Call:  Transition of Care Hospital Discharge Call  Contact Status: Patient Contact Status: Message  Language assistant needed:          Follow-Up Questions:    Dimple Francis Postnatal Depression Scale:  In the Past 7 Days:    PHQ2-9 Depression Scale:     Discharge Follow-up:    Post-discharge interventions: NA  Pearlie Bougie, RN 07/13/2023 15:21
